# Patient Record
Sex: Female | Born: 1970 | Race: Black or African American | Hispanic: No | Smoking: Current every day smoker
Health system: Southern US, Community
[De-identification: ages and names within clinical notes are randomized; demographics above are authoritative.]

## PROBLEM LIST (undated history)

## (undated) DIAGNOSIS — IMO0001 Reserved for inherently not codable concepts without codable children: Secondary | ICD-10-CM

## (undated) DIAGNOSIS — Z800 Family history of malignant neoplasm of digestive organs: Secondary | ICD-10-CM

## (undated) DIAGNOSIS — J386 Stenosis of larynx: Secondary | ICD-10-CM

## (undated) DIAGNOSIS — Z803 Family history of malignant neoplasm of breast: Secondary | ICD-10-CM

## (undated) DIAGNOSIS — Z8542 Personal history of malignant neoplasm of other parts of uterus: Secondary | ICD-10-CM

## (undated) DIAGNOSIS — I2699 Other pulmonary embolism without acute cor pulmonale: Secondary | ICD-10-CM

## (undated) DIAGNOSIS — I5022 Chronic systolic (congestive) heart failure: Secondary | ICD-10-CM

## (undated) DIAGNOSIS — J45909 Unspecified asthma, uncomplicated: Secondary | ICD-10-CM

## (undated) DIAGNOSIS — J9621 Acute and chronic respiratory failure with hypoxia: Secondary | ICD-10-CM

## (undated) DIAGNOSIS — Z789 Other specified health status: Secondary | ICD-10-CM

## (undated) HISTORY — DX: Unspecified asthma, uncomplicated: J45.909

## (undated) HISTORY — DX: Family history of malignant neoplasm of breast: Z80.3

## (undated) HISTORY — DX: Personal history of malignant neoplasm of other parts of uterus: Z85.42

## (undated) HISTORY — PX: TRACHELECTOMY: SHX6586

## (undated) HISTORY — PX: OTHER SURGICAL HISTORY: SHX169

## (undated) HISTORY — DX: Other specified health status: Z78.9

## (undated) HISTORY — DX: Family history of malignant neoplasm of digestive organs: Z80.0

---

## 2014-11-11 ENCOUNTER — Other Ambulatory Visit: Payer: Medicaid Other

## 2014-11-11 ENCOUNTER — Encounter: Payer: Self-pay | Admitting: Genetic Counselor

## 2014-11-11 ENCOUNTER — Ambulatory Visit (HOSPITAL_BASED_OUTPATIENT_CLINIC_OR_DEPARTMENT_OTHER): Payer: Medicare Other | Admitting: Genetic Counselor

## 2014-11-11 DIAGNOSIS — Z800 Family history of malignant neoplasm of digestive organs: Secondary | ICD-10-CM

## 2014-11-11 DIAGNOSIS — Z803 Family history of malignant neoplasm of breast: Secondary | ICD-10-CM

## 2014-11-11 DIAGNOSIS — Z315 Encounter for genetic counseling: Secondary | ICD-10-CM | POA: Diagnosis not present

## 2014-11-11 DIAGNOSIS — Z8542 Personal history of malignant neoplasm of other parts of uterus: Secondary | ICD-10-CM | POA: Diagnosis not present

## 2014-11-11 NOTE — Progress Notes (Signed)
Patient Name: Molly Frazier Patient Age: 43 y.o. Encounter Date: 11/11/2014  Referring Physician: Arnoldo Morale, MD    Ms. Molly Frazier, a 43 y.o. female, is being seen at the [facility] due to a personal and family history of cancer. She presents to clinic today with her daughter, Alphonsa Overall, to discuss the possibility of a hereditary predisposition to cancer and discuss whether genetic testing is warranted.  HISTORY OF PRESENT ILLNESS: Molly Frazier reported that she was diagnosed with uterine cancer at the age of 75. She reports having had a hysterectomy, but that her ovaries were left intact.  She reports getting a yearly mammogram, clinical breast exam and gynecologic exam. She has not yet had a colonoscopy.   Past Medical History  Diagnosis Date  . History of uterine cancer     Dx at age 76  . Family history of breast cancer   . Family history of colon cancer     History   Social History  . Marital Status: Unknown    Spouse Name: N/A    Number of Children: N/A  . Years of Education: N/A   Social History Main Topics  . Smoking status: Not on file  . Smokeless tobacco: Not on file  . Alcohol Use: Not on file  . Drug Use: Not on file  . Sexual Activity: Not on file   Other Topics Concern  . Not on file   Social History Narrative  . No narrative on file     FAMILY HISTORY:   During the visit, a 4-generation pedigree was obtained. Family tree will be sent for scanning and will be in EPIC under the Media tab.  Significant diagnoses include the following:  Family History  Problem Relation Age of Onset  . Breast cancer Mother 28    currently in her 49s; TAH/BSO ~50  . Colon cancer Sister 74    Additionally, Molly Frazier has three daughters (age 84, 51, and 63). She has 2 brothers and 6 sisters other than the sister noted above. These siblings are young (ages 2s-30s). Her mother has many siblings and she has no information about her father.  Ms.  Frazier ancestry is African American. There is no known Jewish ancestry and no consanguinity.  ASSESSMENT AND PLAN: Molly Frazier is a 43 y.o. female with a personal history of uterine cancer diagnosed at age 37 and a family history of colon cancer at age 79 in her sister and breast cancer at age 51 in her maternal grandmother. Even though she has a large family, this history is suggestive of a hereditary predisposition to cancer, specifically Lynch syndrome. Her siblings are relatively young. Furthermore, there is no information at all about her father or any paternal relatives. We also addressed some of the genes that may cause breast cancer due to her mother's history. We reviewed the characteristics, features and inheritance patterns of hereditary cancer syndromes. We also discussed genetic testing, including the other appropriate family members to test, the process of testing, insurance coverage and implications of results. A negative result will need to be interpreted with some caution and she is aware that her risk of colon and breast cancers would be considered elevated given the family history.  Ms. Fojtik wished to pursue genetic testing and a blood sample will be sent to [facility] for analysis of the 24 genes on the OvaNext gene panel (ATM, BARD1, BRCA1, BRCA2, BRIP1, CDH1, CHEK2, EPCAM, MLH1, MRE11A, MSH2, MSH6, MUTYH, NBN, NF1, PALB2, PMS2,  PTEN, RAD50, RAD51C, RAD51D, SMARCA4, STK11, and TP53). This panel was selected to combine the Lynch genes with those contributing to breast cancer. We discussed the implications of a positive, negative and/ or Variant of Uncertain Significance (VUS) result. Results should be available in approximately 4-5 weeks, at which point we will contact her and address implications for her as well as address genetic testing for at-risk family members, if needed.    We encouraged Ms. Redditt to remain in contact with Cancer Genetics annually so that we can update the family  history and inform her of any changes in cancer genetics and testing that may be of benefit for this family. Ms.  Husk questions were answered to her satisfaction today.   Thank you for the referral and allowing [place] to share in the care of your patient.   The patient was seen for a total of 30 minutes, greater than 50% of which was spent face-to-face counseling. This patient was discussed with the overseeing provider who agrees with the above.   Steele Berg, MS, [place] Certified Genetic Counselor phone: [phone] [email or web address]

## 2014-12-09 ENCOUNTER — Encounter: Payer: Self-pay | Admitting: Genetic Counselor

## 2014-12-09 DIAGNOSIS — Z1379 Encounter for other screening for genetic and chromosomal anomalies: Secondary | ICD-10-CM | POA: Insufficient documentation

## 2014-12-09 NOTE — Progress Notes (Signed)
GENETIC TEST RESULTS  Patient Name: Molly Frazier Patient Age: 43 y.o. Encounter Date: 12/09/2014  Referring Physician: Arnoldo Morale, MD   Ms. Vanloan was called today to discuss genetic test results. Please see the Genetics note from her visit on 11/11/14 for a detailed discussion of her personal and family history.  GENETIC TESTING: At the time of Ms. Tonkinson's visit, we recommended she pursue genetic testing of multiple genes on the OvaNext gene panel. This test, which included sequencing and deletion/duplication analysis of 24 genes, was performed at [facility]. Testing was normal and did not reveal a mutation in these genes. The genes tested were ATM, BARD1, BRCA1, BRCA2, BRIP1, CDH1, CHEK2, EPCAM, MLH1, MRE11A, MSH2, MSH6, MUTYH, NBN, NF1, PALB2, PMS2, PTEN, RAD50, RAD51C, RAD51D, SMARCA4, STK11, and TP53.  We discussed with Ms. Millar that since the current test is not perfect, it is possible there may be a gene mutation that current testing cannot detect, but that chance is small. We also discussed that it is possible that a different genetic factor, which was not part of this testing or has not yet been discovered, is responsible for the cancer diagnoses in the family. Should Ms. Closson wish to discuss or pursue this additional testing, we are happy to coordinate this at any time, but do not feel that she is at significant risk of harboring a mutation in a different gene. Lastly, it may be that her sister who had colon cancer harbors a pathogenic mutation that Ms. Gillin did not inherit.  CANCER SCREENING: Given the personal and family histories, we must interpret these negative results with some caution. Families with features suggestive of hereditary risk tend to have multiple family members with cancer, diagnoses in multiple generations and diagnoses before the age of 72. Ms. Burdett family exhibits some of these features.  Ms. Gaetano is recommended to continue having a yearly  mammogram, clinical breast exam and gynecologic exam. Given her sister's age at colon cancer diagnosis (age 64), she is recommended to speak with her physicians about initiating colon cancer screening now.   FAMILY MEMBERS: We recommended further genetic testing in Ms. Gabrys's family as such testing might help [place] be even more confident in interpreting Ms. Jacuinde's own results. Her sister who reportedly had colon cancer at age 53 should get tested. Please let us know if we can help facilitate testing. Genetic counselors can be located in other cities, by visiting the website of the [employer] (ArtistMovie.se) and Field seismologist for a Dietitian by zip code. If a mutation is found in her sister, Ms. Dines should provide [place] a copy so we can confirm she does not have that mutation.  Given the family history, Ms. Knowlton's relatives may be at increased risk for cancer over the general population and should speak with their own doctors about appropriate cancer screenings.   Lastly, we discussed with Ms. Payes that cancer genetics is a rapidly advancing field and it is possible that new genetic tests will be appropriate for her in the future. We encouraged her to remain in contact with [place] on an annual basis so we can update her personal and family histories, and let her know of advances in cancer genetics that may benefit the family. Our contact number was provided. Ms. Rahrig questions were answered to her satisfaction today, and she knows she is welcome to call anytime with additional questions.    Steele Berg, MS, [place] Certified Genetic Counselor phone: [phone] [email or web address]

## 2015-08-08 DIAGNOSIS — J45909 Unspecified asthma, uncomplicated: Secondary | ICD-10-CM | POA: Insufficient documentation

## 2015-08-08 DIAGNOSIS — J309 Allergic rhinitis, unspecified: Principal | ICD-10-CM

## 2015-08-08 DIAGNOSIS — Z91018 Allergy to other foods: Secondary | ICD-10-CM

## 2015-08-08 DIAGNOSIS — H1010 Acute atopic conjunctivitis, unspecified eye: Secondary | ICD-10-CM

## 2015-08-08 DIAGNOSIS — K200 Eosinophilic esophagitis: Secondary | ICD-10-CM | POA: Insufficient documentation

## 2015-09-06 ENCOUNTER — Ambulatory Visit (INDEPENDENT_AMBULATORY_CARE_PROVIDER_SITE_OTHER): Payer: Medicare Other

## 2015-09-06 DIAGNOSIS — J309 Allergic rhinitis, unspecified: Secondary | ICD-10-CM | POA: Diagnosis not present

## 2015-09-06 DIAGNOSIS — H1010 Acute atopic conjunctivitis, unspecified eye: Secondary | ICD-10-CM | POA: Diagnosis not present

## 2015-09-09 HISTORY — PX: ESOPHAGEAL DILATION: SHX303

## 2015-09-12 ENCOUNTER — Ambulatory Visit (INDEPENDENT_AMBULATORY_CARE_PROVIDER_SITE_OTHER): Payer: Medicare Other

## 2015-09-12 DIAGNOSIS — H1010 Acute atopic conjunctivitis, unspecified eye: Secondary | ICD-10-CM

## 2015-09-12 DIAGNOSIS — J309 Allergic rhinitis, unspecified: Secondary | ICD-10-CM

## 2015-09-13 ENCOUNTER — Ambulatory Visit (INDEPENDENT_AMBULATORY_CARE_PROVIDER_SITE_OTHER): Payer: Medicare Other | Admitting: Pediatrics

## 2015-09-13 ENCOUNTER — Ambulatory Visit: Payer: Medicare Other

## 2015-09-13 ENCOUNTER — Encounter: Payer: Self-pay | Admitting: Pediatrics

## 2015-09-13 VITALS — BP 110/62 | HR 94 | Temp 99.0°F | Resp 18 | Ht 61.5 in | Wt 167.0 lb

## 2015-09-13 DIAGNOSIS — T7840XA Allergy, unspecified, initial encounter: Secondary | ICD-10-CM

## 2015-09-13 DIAGNOSIS — J4530 Mild persistent asthma, uncomplicated: Secondary | ICD-10-CM

## 2015-09-13 MED ORDER — MONTELUKAST SODIUM 10 MG PO TABS: 10.0000 mg | ORAL_TABLET | Freq: Every day | ORAL | Status: DC

## 2015-09-13 MED ORDER — LEVOCETIRIZINE DIHYDROCHLORIDE 5 MG PO TABS: 5.0000 mg | ORAL_TABLET | Freq: Every evening | ORAL | Status: DC

## 2015-09-13 NOTE — Assessment & Plan Note (Signed)
The patient has stopped all of her asthma medications. If she needs to use pro-air more than twice a week, she will resume montelukast as 10 mg once a day. She should have a flu vaccination follow-up in 3 months but she will call us if she is not doing well on this treatment plan

## 2015-09-13 NOTE — Patient Instructions (Signed)
Allergic reaction Since she had an allergic reaction we will reduce her dose of allergy injections to Gold vial according to schedule A  She will resume the use of levocetirizine 5 mg once a day. If she needs to use albuterol more than twice a week, she she will resume montelukast 10 mg once a day.  We will see her in follow-up in 3 months.  Mild persistent asthma The patient has stopped all of her asthma medications. If she needs to use pro-air more than twice a week, she will resume montelukast as 10 mg once a day. She should have a flu vaccination follow-up in 3 months but she will call us if she is not doing well on this treatment plan   Return in about 3 months (around 12/14/2015).

## 2015-09-13 NOTE — Assessment & Plan Note (Signed)
Since she had an allergic reaction we will reduce her dose of allergy injections to Gold vial according to schedule A  She will resume the use of levocetirizine 5 mg once a day. If she needs to use albuterol more than twice a week, she she will resume montelukast 10 mg once a day.  We will see her in follow-up in 3 months.

## 2015-09-13 NOTE — Progress Notes (Signed)
NEW PATIENT NOTE  RE: Molly Frazier MRN: [id number] DOB: 30-Jan-1971 [facility] [address] [place] [address] Date of Office Visit: 09/13/2015   Referring provider: No referring provider defined for this encounter.  Chief Complaint: Allergic Reaction    HPI the patient received an allergy injection out of the green vial on Oct.3 She received 0.2 mL. She developed burning of her face and some itching. She was taken to the emergency room and was given Benadryl. She has not had any coughing or wheezing. She has not been using levocetirizine or montelukast om a daily basis She had an esophageal dilation on 09/09/2015    ROS: Per HPI unless specifically indicated below ROS   Drug Allergies:  Allergies  Allergen Reactions  . Oxycodone Nausea And Vomiting  . Wellbutrin [Bupropion]   . Benadryl [Diphenhydramine Hcl] Rash  . Vicodin [Hydrocodone-Acetaminophen] Rash     Physical Exam: BP 110/62 mmHg  Pulse 94  Temp(Src) 99 F (37.2 C) (Oral)  Resp 18  Ht 5' 1.5" (1.562 m)  Wt 167 lb (75.751 kg)  BMI 31.05 kg/m2  Physical Exam  Constitutional: She appears well-developed and well-nourished.  HENT:  Right Ear: Tympanic membrane normal.  Left Ear: Tympanic membrane normal.  Nose: Mucosal edema and rhinorrhea present.  Mouth/Throat: Oropharynx is clear and moist.  Eyes: Conjunctivae are normal.  Neck: Neck supple. No thyromegaly present.  Cardiovascular: Normal rate, regular rhythm and normal heart sounds.   No murmur heard. Pulmonary/Chest: Effort normal and breath sounds normal.  Abdominal: Soft. There is no hepatomegaly.  Lymphadenopathy:    She has no cervical adenopathy.  Neurological: She is alert.  Skin:  clear      Diagnostics:  Forced vital capacity 2.81 L FEV1 2.34 L predicted forced vital capacity 2.58 L predicted FEV1 2.18 L-the spirometry is in the normal  range   Assessment and Plan:  Allergic reaction Since she had an allergic reaction we will reduce her dose of allergy injections to Gold vial according to schedule A  She will resume the use of levocetirizine 5 mg once a day. If she needs to use albuterol more than twice a week, she she will resume montelukast 10 mg once a day.  We will see her in follow-up in 3 months.  Mild persistent asthma The patient has stopped all of her asthma medications. If she needs to use pro-air more than twice a week, she will resume montelukast as 10 mg once a day. She should have a flu vaccination follow-up in 3 months but she will call us if she is not doing well on this treatment plan    Return in about 3 months (around 12/14/2015).  Thank you for the opportunity to care for this patient.  Please do not hesitate to contact me with questions.  [facility] [address] [place], [place] [address] [phone]  J. Posey Rea, M.D.

## 2015-09-19 ENCOUNTER — Ambulatory Visit (INDEPENDENT_AMBULATORY_CARE_PROVIDER_SITE_OTHER): Payer: Medicare Other

## 2015-09-19 DIAGNOSIS — J309 Allergic rhinitis, unspecified: Secondary | ICD-10-CM

## 2015-09-19 DIAGNOSIS — H1010 Acute atopic conjunctivitis, unspecified eye: Secondary | ICD-10-CM | POA: Diagnosis not present

## 2015-09-26 ENCOUNTER — Ambulatory Visit (INDEPENDENT_AMBULATORY_CARE_PROVIDER_SITE_OTHER): Payer: Medicare Other | Admitting: *Deleted

## 2015-09-26 DIAGNOSIS — J309 Allergic rhinitis, unspecified: Secondary | ICD-10-CM

## 2015-09-26 DIAGNOSIS — H1010 Acute atopic conjunctivitis, unspecified eye: Secondary | ICD-10-CM

## 2015-10-04 ENCOUNTER — Ambulatory Visit (INDEPENDENT_AMBULATORY_CARE_PROVIDER_SITE_OTHER): Payer: Medicare Other | Admitting: *Deleted

## 2015-10-04 DIAGNOSIS — H1010 Acute atopic conjunctivitis, unspecified eye: Secondary | ICD-10-CM

## 2015-10-04 DIAGNOSIS — J309 Allergic rhinitis, unspecified: Secondary | ICD-10-CM

## 2015-10-10 ENCOUNTER — Ambulatory Visit (INDEPENDENT_AMBULATORY_CARE_PROVIDER_SITE_OTHER): Payer: Medicare Other

## 2015-10-10 DIAGNOSIS — J309 Allergic rhinitis, unspecified: Secondary | ICD-10-CM

## 2015-10-17 ENCOUNTER — Ambulatory Visit (INDEPENDENT_AMBULATORY_CARE_PROVIDER_SITE_OTHER): Payer: Medicare Other

## 2015-10-17 DIAGNOSIS — J309 Allergic rhinitis, unspecified: Secondary | ICD-10-CM | POA: Diagnosis not present

## 2015-10-31 ENCOUNTER — Ambulatory Visit (INDEPENDENT_AMBULATORY_CARE_PROVIDER_SITE_OTHER): Payer: Medicare Other

## 2015-10-31 DIAGNOSIS — J309 Allergic rhinitis, unspecified: Secondary | ICD-10-CM | POA: Diagnosis not present

## 2015-11-07 ENCOUNTER — Ambulatory Visit (INDEPENDENT_AMBULATORY_CARE_PROVIDER_SITE_OTHER): Payer: Medicare Other

## 2015-11-07 DIAGNOSIS — J309 Allergic rhinitis, unspecified: Secondary | ICD-10-CM

## 2015-11-14 ENCOUNTER — Ambulatory Visit (INDEPENDENT_AMBULATORY_CARE_PROVIDER_SITE_OTHER): Payer: Medicare Other | Admitting: *Deleted

## 2015-11-14 DIAGNOSIS — J309 Allergic rhinitis, unspecified: Secondary | ICD-10-CM

## 2015-11-21 ENCOUNTER — Ambulatory Visit (INDEPENDENT_AMBULATORY_CARE_PROVIDER_SITE_OTHER): Payer: Medicare Other

## 2015-11-21 DIAGNOSIS — J309 Allergic rhinitis, unspecified: Secondary | ICD-10-CM | POA: Diagnosis not present

## 2015-11-28 ENCOUNTER — Ambulatory Visit (INDEPENDENT_AMBULATORY_CARE_PROVIDER_SITE_OTHER): Payer: Medicare Other | Admitting: Allergy and Immunology

## 2015-11-28 ENCOUNTER — Encounter: Payer: Self-pay | Admitting: Allergy and Immunology

## 2015-11-28 VITALS — BP 110/60 | HR 72 | Temp 98.0°F | Resp 16 | Ht 61.42 in | Wt 166.6 lb

## 2015-11-28 DIAGNOSIS — Z91018 Allergy to other foods: Secondary | ICD-10-CM | POA: Diagnosis not present

## 2015-11-28 DIAGNOSIS — J4530 Mild persistent asthma, uncomplicated: Secondary | ICD-10-CM

## 2015-11-28 DIAGNOSIS — H1010 Acute atopic conjunctivitis, unspecified eye: Secondary | ICD-10-CM

## 2015-11-28 DIAGNOSIS — K200 Eosinophilic esophagitis: Secondary | ICD-10-CM

## 2015-11-28 DIAGNOSIS — J309 Allergic rhinitis, unspecified: Secondary | ICD-10-CM

## 2015-11-28 MED ORDER — LEVOCETIRIZINE DIHYDROCHLORIDE 5 MG PO TABS: 5.0000 mg | ORAL_TABLET | Freq: Every evening | ORAL | Status: DC

## 2015-11-28 MED ORDER — ALBUTEROL SULFATE HFA 108 (90 BASE) MCG/ACT IN AERS: 2.0000 | INHALATION_SPRAY | Freq: Four times a day (QID) | RESPIRATORY_TRACT | Status: DC | PRN

## 2015-11-28 MED ORDER — MONTELUKAST SODIUM 10 MG PO TABS: 10.0000 mg | ORAL_TABLET | Freq: Every day | ORAL | Status: DC

## 2015-11-28 MED ORDER — FLUTICASONE PROPIONATE 50 MCG/ACT NA SUSP: 2.0000 | NASAL | Status: DC | PRN

## 2015-11-28 NOTE — Progress Notes (Signed)
RE: Molly Frazier MRN: [id number] DOB: Apr 22, 1971 [facility] [facility] [address] [place] [id number] Date of Office Visit: 11/28/2015  Referring provider: No referring provider defined for this encounter.  History of present illness: HPI Comments: Molly Frazier is a 44 y.o. female with allergic rhinoconjunctivitis, asthma, and history of eosinophilic esophagitis who presents today for follow up.  He reports that her asthma has been well-controlled in the interval since her previous visit.  She only requires albuterol rescue on rare occasions during rapid weather changes.  She has no nasal symptom complaints today.  She reports that since her esophageal dilation 3 or 4 months ago, she has had no eosinophilic esophagitis related complaints.  She has been instructed by her gastroenterologist to follow up on an as-needed basis.  He reports that she quit smoking cigarettes 2 months ago.  She needs medication refills.   Assessment and plan: Mild persistent asthma  Continue montelukast 10 mg daily at bedtime and albuterol every 4-6 hours as needed.  Subjective and objective measures of pulmonary function will be followed and the treatment plan will be adjusted accordingly.  Allergic rhinoconjunctivitis  Continue allergen avoidance measures, montelukast daily, levocetirizine 5 mg daily as needed, and fluticasone nasal spray as needed.  Esophagitis, eosinophilic  Follow up with gastroenterologist as directed.  History of food allergy  Continue avoidance of egg and have access to epinephrine autoinjector 2 pack until allergy is definitively ruled out with open graded oral challenge.    Medications ordered this encounter: Meds ordered this encounter  Medications  . levocetirizine (XYZAL) 5 MG tablet    Sig: Take 1 tablet (5 mg total) by mouth every evening.    Dispense:  30 tablet    Refill:  5  . montelukast  (SINGULAIR) 10 MG tablet    Sig: Take 1 tablet (10 mg total) by mouth at bedtime.    Dispense:  30 tablet    Refill:  5  . fluticasone (FLONASE) 50 MCG/ACT nasal spray    Sig: Place 2 sprays into both nostrils as needed for allergies or rhinitis. Reported on 11/28/2015    Dispense:  15.8 g    Refill:  5  . albuterol (PROAIR HFA) 108 (90 BASE) MCG/ACT inhaler    Sig: Inhale 2 puffs into the lungs every 6 (six) hours as needed. Reported on 11/28/2015    Dispense:  1 Inhaler    Refill:  3    Diagnositics: Spirometry:  Normal with an FEV1 of 114% predicted.  Please see scanned spirometry results for details.    Physical examination: Blood pressure 110/60, pulse 72, temperature 98 F (36.7 C), resp. rate 16, height 5' 1.42" (1.56 m), weight 166 lb 9.6 oz (75.569 kg).  General: Alert, interactive, in no acute distress. HEENT: TMs pearly gray, turbinates mildly edematous without discharge, post-pharynx mildly erythematous. Neck: Supple without lymphadenopathy. Lungs: Clear to auscultation without wheezing, rhonchi or rales. CV: Normal S1, S2 without murmurs. Skin: Warm and dry, without lesions or rashes.  The following portions of the patient's history were reviewed and updated as appropriate: allergies, current medications, past family history, past medical history, past social history, past surgical history and problem list.  Outpatient medications:   Medication List       This list is accurate as of: 11/28/15  1:32 PM.  Always use your most recent med list.  albuterol 108 (90 BASE) MCG/ACT inhaler  Commonly known as:  PROAIR HFA  Inhale 2 puffs into the lungs every 6 (six) hours as needed. Reported on 11/28/2015     carbamazepine 200 MG 12 hr capsule  Commonly known as:  CARBATROL  Take 200 mg by mouth 2 (two) times daily. Reported on 11/28/2015     CHANTIX PO  Take 1 tablet by mouth 2 (two) times daily. Reported on 11/28/2015     clonazePAM 0.5 MG tablet   Commonly known as:  KLONOPIN  Take 0.5 mg by mouth 2 (two) times daily. Reported on 11/28/2015     cyclobenzaprine 10 MG tablet  Commonly known as:  FLEXERIL  Take 10 mg by mouth 3 (three) times daily. Reported on 11/28/2015     EPIPEN 2-PAK 0.3 mg/0.3 mL Soaj injection  Generic drug:  EPINEPHrine  Inject 0.3 mg into the muscle as needed.     fluticasone 50 MCG/ACT nasal spray  Commonly known as:  FLONASE  Place 2 sprays into both nostrils as needed for allergies or rhinitis. Reported on 11/28/2015     levocetirizine 5 MG tablet  Commonly known as:  XYZAL  Take 1 tablet (5 mg total) by mouth every evening.     loxapine 50 MG capsule  Commonly known as:  LOXITANE  Take 50 mg by mouth 2 (two) times daily.     meloxicam 7.5 MG tablet  Commonly known as:  MOBIC  Take 7.5 mg by mouth daily. Reported on 11/28/2015     montelukast 10 MG tablet  Commonly known as:  SINGULAIR  Take 1 tablet (10 mg total) by mouth at bedtime.     omeprazole 40 MG capsule  Commonly known as:  PRILOSEC  Take 40 mg by mouth 2 (two) times daily. Reported on 11/28/2015        Known medication allergies: Allergies  Allergen Reactions  . Oxycodone Nausea And Vomiting  . Wellbutrin [Bupropion]   . Benadryl [Diphenhydramine Hcl] Rash  . Vicodin [Hydrocodone-Acetaminophen] Rash    I appreciate the opportunity to take part in this [name]'s care. Please do not hesitate to contact me with questions.  Sincerely,   R. Jorene Guest, MD

## 2015-11-28 NOTE — Assessment & Plan Note (Signed)
   Continue avoidance of egg and have access to epinephrine autoinjector 2 pack until allergy is definitively ruled out with open graded oral challenge.

## 2015-11-28 NOTE — Assessment & Plan Note (Signed)
   Continue allergen avoidance measures, montelukast daily, levocetirizine 5 mg daily as needed, and fluticasone nasal spray as needed.

## 2015-11-28 NOTE — Patient Instructions (Signed)
Mild persistent asthma  Continue montelukast 10 mg daily at bedtime and albuterol every 4-6 hours as needed.  Subjective and objective measures of pulmonary function will be followed and the treatment plan will be adjusted accordingly.  Allergic rhinoconjunctivitis  Continue allergen avoidance measures, montelukast daily, levocetirizine 5 mg daily as needed, and fluticasone nasal spray as needed.  Esophagitis, eosinophilic  Follow up with gastroenterologist as directed.  History of food allergy  Continue avoidance of egg and have access to epinephrine autoinjector 2 pack until allergy is definitively ruled out with open graded oral challenge.   Return in about 6 months (around 05/28/2016), or if symptoms worsen or fail to improve.

## 2015-11-28 NOTE — Assessment & Plan Note (Signed)
   Follow up with gastroenterologist as directed.

## 2015-11-28 NOTE — Assessment & Plan Note (Signed)
   Continue montelukast 10 mg daily at bedtime and albuterol every 4-6 hours as needed.  Subjective and objective measures of pulmonary function will be followed and the treatment plan will be adjusted accordingly.

## 2015-12-06 ENCOUNTER — Ambulatory Visit (INDEPENDENT_AMBULATORY_CARE_PROVIDER_SITE_OTHER): Payer: Medicare Other | Admitting: *Deleted

## 2015-12-06 DIAGNOSIS — J309 Allergic rhinitis, unspecified: Secondary | ICD-10-CM | POA: Diagnosis not present

## 2015-12-13 ENCOUNTER — Ambulatory Visit (INDEPENDENT_AMBULATORY_CARE_PROVIDER_SITE_OTHER): Payer: Medicare Other | Admitting: *Deleted

## 2015-12-13 DIAGNOSIS — J309 Allergic rhinitis, unspecified: Secondary | ICD-10-CM

## 2015-12-20 ENCOUNTER — Ambulatory Visit (INDEPENDENT_AMBULATORY_CARE_PROVIDER_SITE_OTHER): Payer: Medicare Other

## 2015-12-20 DIAGNOSIS — J309 Allergic rhinitis, unspecified: Secondary | ICD-10-CM

## 2015-12-27 ENCOUNTER — Ambulatory Visit (INDEPENDENT_AMBULATORY_CARE_PROVIDER_SITE_OTHER): Payer: Medicare Other

## 2015-12-27 DIAGNOSIS — J309 Allergic rhinitis, unspecified: Secondary | ICD-10-CM

## 2016-01-02 ENCOUNTER — Ambulatory Visit (INDEPENDENT_AMBULATORY_CARE_PROVIDER_SITE_OTHER): Payer: Medicare Other | Admitting: *Deleted

## 2016-01-02 DIAGNOSIS — J309 Allergic rhinitis, unspecified: Secondary | ICD-10-CM | POA: Diagnosis not present

## 2016-01-09 ENCOUNTER — Ambulatory Visit (INDEPENDENT_AMBULATORY_CARE_PROVIDER_SITE_OTHER): Payer: Medicare Other

## 2016-01-09 DIAGNOSIS — J309 Allergic rhinitis, unspecified: Secondary | ICD-10-CM

## 2016-01-16 ENCOUNTER — Ambulatory Visit (INDEPENDENT_AMBULATORY_CARE_PROVIDER_SITE_OTHER): Payer: Medicare Other | Admitting: *Deleted

## 2016-01-16 DIAGNOSIS — J309 Allergic rhinitis, unspecified: Secondary | ICD-10-CM | POA: Diagnosis not present

## 2016-01-24 ENCOUNTER — Ambulatory Visit (INDEPENDENT_AMBULATORY_CARE_PROVIDER_SITE_OTHER): Payer: Medicare Other

## 2016-01-24 DIAGNOSIS — J309 Allergic rhinitis, unspecified: Secondary | ICD-10-CM | POA: Diagnosis not present

## 2016-01-30 ENCOUNTER — Ambulatory Visit (INDEPENDENT_AMBULATORY_CARE_PROVIDER_SITE_OTHER): Payer: Medicare Other

## 2016-01-30 DIAGNOSIS — J309 Allergic rhinitis, unspecified: Secondary | ICD-10-CM

## 2016-02-06 ENCOUNTER — Ambulatory Visit (INDEPENDENT_AMBULATORY_CARE_PROVIDER_SITE_OTHER): Payer: Medicare Other

## 2016-02-06 DIAGNOSIS — J309 Allergic rhinitis, unspecified: Secondary | ICD-10-CM | POA: Diagnosis not present

## 2016-02-13 ENCOUNTER — Ambulatory Visit (INDEPENDENT_AMBULATORY_CARE_PROVIDER_SITE_OTHER): Payer: Medicare Other

## 2016-02-13 DIAGNOSIS — J309 Allergic rhinitis, unspecified: Secondary | ICD-10-CM | POA: Diagnosis not present

## 2016-02-20 ENCOUNTER — Ambulatory Visit (INDEPENDENT_AMBULATORY_CARE_PROVIDER_SITE_OTHER): Payer: Medicare Other

## 2016-02-20 DIAGNOSIS — J309 Allergic rhinitis, unspecified: Secondary | ICD-10-CM | POA: Diagnosis not present

## 2016-02-27 ENCOUNTER — Ambulatory Visit (INDEPENDENT_AMBULATORY_CARE_PROVIDER_SITE_OTHER): Payer: Medicare Other

## 2016-02-27 DIAGNOSIS — J309 Allergic rhinitis, unspecified: Secondary | ICD-10-CM | POA: Diagnosis not present

## 2016-03-05 ENCOUNTER — Ambulatory Visit (INDEPENDENT_AMBULATORY_CARE_PROVIDER_SITE_OTHER): Payer: Medicare Other

## 2016-03-05 DIAGNOSIS — J309 Allergic rhinitis, unspecified: Secondary | ICD-10-CM

## 2016-03-10 DIAGNOSIS — J301 Allergic rhinitis due to pollen: Secondary | ICD-10-CM | POA: Diagnosis not present

## 2016-03-11 DIAGNOSIS — J3081 Allergic rhinitis due to animal (cat) (dog) hair and dander: Secondary | ICD-10-CM

## 2016-03-12 ENCOUNTER — Ambulatory Visit (INDEPENDENT_AMBULATORY_CARE_PROVIDER_SITE_OTHER): Payer: Medicare Other

## 2016-03-12 DIAGNOSIS — J309 Allergic rhinitis, unspecified: Secondary | ICD-10-CM | POA: Diagnosis not present

## 2016-03-19 ENCOUNTER — Ambulatory Visit (INDEPENDENT_AMBULATORY_CARE_PROVIDER_SITE_OTHER): Payer: Medicare Other

## 2016-03-19 DIAGNOSIS — J309 Allergic rhinitis, unspecified: Secondary | ICD-10-CM

## 2016-03-26 ENCOUNTER — Ambulatory Visit (INDEPENDENT_AMBULATORY_CARE_PROVIDER_SITE_OTHER): Payer: Medicare Other

## 2016-03-26 DIAGNOSIS — J309 Allergic rhinitis, unspecified: Secondary | ICD-10-CM | POA: Diagnosis not present

## 2016-04-04 ENCOUNTER — Ambulatory Visit (INDEPENDENT_AMBULATORY_CARE_PROVIDER_SITE_OTHER): Payer: Medicare Other

## 2016-04-04 DIAGNOSIS — J309 Allergic rhinitis, unspecified: Secondary | ICD-10-CM

## 2016-04-10 ENCOUNTER — Ambulatory Visit (INDEPENDENT_AMBULATORY_CARE_PROVIDER_SITE_OTHER): Payer: Medicare Other

## 2016-04-10 DIAGNOSIS — J309 Allergic rhinitis, unspecified: Secondary | ICD-10-CM | POA: Diagnosis not present

## 2016-04-16 ENCOUNTER — Ambulatory Visit (INDEPENDENT_AMBULATORY_CARE_PROVIDER_SITE_OTHER): Payer: Medicare Other

## 2016-04-16 DIAGNOSIS — J309 Allergic rhinitis, unspecified: Secondary | ICD-10-CM | POA: Diagnosis not present

## 2016-04-23 ENCOUNTER — Ambulatory Visit (INDEPENDENT_AMBULATORY_CARE_PROVIDER_SITE_OTHER): Payer: Medicare Other

## 2016-04-23 DIAGNOSIS — J309 Allergic rhinitis, unspecified: Secondary | ICD-10-CM | POA: Diagnosis not present

## 2016-04-30 ENCOUNTER — Ambulatory Visit (INDEPENDENT_AMBULATORY_CARE_PROVIDER_SITE_OTHER): Payer: Medicare Other

## 2016-04-30 DIAGNOSIS — J309 Allergic rhinitis, unspecified: Secondary | ICD-10-CM

## 2016-05-08 ENCOUNTER — Ambulatory Visit (INDEPENDENT_AMBULATORY_CARE_PROVIDER_SITE_OTHER): Payer: Medicare Other

## 2016-05-08 DIAGNOSIS — J309 Allergic rhinitis, unspecified: Secondary | ICD-10-CM

## 2016-05-14 ENCOUNTER — Ambulatory Visit (INDEPENDENT_AMBULATORY_CARE_PROVIDER_SITE_OTHER): Payer: Medicare Other

## 2016-05-14 DIAGNOSIS — J309 Allergic rhinitis, unspecified: Secondary | ICD-10-CM

## 2016-05-21 ENCOUNTER — Ambulatory Visit (INDEPENDENT_AMBULATORY_CARE_PROVIDER_SITE_OTHER): Payer: Medicare Other

## 2016-05-21 DIAGNOSIS — J309 Allergic rhinitis, unspecified: Secondary | ICD-10-CM

## 2016-05-28 ENCOUNTER — Ambulatory Visit (INDEPENDENT_AMBULATORY_CARE_PROVIDER_SITE_OTHER): Payer: Medicare Other

## 2016-05-28 DIAGNOSIS — J309 Allergic rhinitis, unspecified: Secondary | ICD-10-CM

## 2016-06-05 ENCOUNTER — Ambulatory Visit (INDEPENDENT_AMBULATORY_CARE_PROVIDER_SITE_OTHER): Payer: Medicare Other

## 2016-06-05 DIAGNOSIS — J309 Allergic rhinitis, unspecified: Secondary | ICD-10-CM | POA: Diagnosis not present

## 2016-06-13 ENCOUNTER — Ambulatory Visit (INDEPENDENT_AMBULATORY_CARE_PROVIDER_SITE_OTHER): Payer: Medicare Other

## 2016-06-13 DIAGNOSIS — J309 Allergic rhinitis, unspecified: Secondary | ICD-10-CM

## 2016-06-18 ENCOUNTER — Ambulatory Visit (INDEPENDENT_AMBULATORY_CARE_PROVIDER_SITE_OTHER): Payer: Medicare Other

## 2016-06-18 DIAGNOSIS — J309 Allergic rhinitis, unspecified: Secondary | ICD-10-CM | POA: Diagnosis not present

## 2016-06-25 ENCOUNTER — Ambulatory Visit (INDEPENDENT_AMBULATORY_CARE_PROVIDER_SITE_OTHER): Payer: Medicare Other

## 2016-06-25 DIAGNOSIS — J309 Allergic rhinitis, unspecified: Secondary | ICD-10-CM | POA: Diagnosis not present

## 2016-07-02 ENCOUNTER — Ambulatory Visit (INDEPENDENT_AMBULATORY_CARE_PROVIDER_SITE_OTHER): Payer: Medicare Other

## 2016-07-02 DIAGNOSIS — J309 Allergic rhinitis, unspecified: Secondary | ICD-10-CM

## 2016-07-09 ENCOUNTER — Ambulatory Visit (INDEPENDENT_AMBULATORY_CARE_PROVIDER_SITE_OTHER): Payer: Medicare Other

## 2016-07-09 DIAGNOSIS — J309 Allergic rhinitis, unspecified: Secondary | ICD-10-CM

## 2016-07-12 DIAGNOSIS — J3081 Allergic rhinitis due to animal (cat) (dog) hair and dander: Secondary | ICD-10-CM | POA: Diagnosis not present

## 2016-07-16 ENCOUNTER — Ambulatory Visit (INDEPENDENT_AMBULATORY_CARE_PROVIDER_SITE_OTHER): Payer: Medicare Other

## 2016-07-16 DIAGNOSIS — J309 Allergic rhinitis, unspecified: Secondary | ICD-10-CM

## 2016-07-23 ENCOUNTER — Ambulatory Visit (INDEPENDENT_AMBULATORY_CARE_PROVIDER_SITE_OTHER): Payer: Medicare Other

## 2016-07-23 DIAGNOSIS — J309 Allergic rhinitis, unspecified: Secondary | ICD-10-CM

## 2016-07-30 ENCOUNTER — Ambulatory Visit (INDEPENDENT_AMBULATORY_CARE_PROVIDER_SITE_OTHER): Payer: Medicare Other

## 2016-07-30 DIAGNOSIS — J309 Allergic rhinitis, unspecified: Secondary | ICD-10-CM | POA: Diagnosis not present

## 2016-08-06 ENCOUNTER — Ambulatory Visit (INDEPENDENT_AMBULATORY_CARE_PROVIDER_SITE_OTHER): Payer: Medicare Other

## 2016-08-06 DIAGNOSIS — J309 Allergic rhinitis, unspecified: Secondary | ICD-10-CM | POA: Diagnosis not present

## 2016-08-14 ENCOUNTER — Ambulatory Visit (INDEPENDENT_AMBULATORY_CARE_PROVIDER_SITE_OTHER): Payer: Medicare Other

## 2016-08-14 DIAGNOSIS — J309 Allergic rhinitis, unspecified: Secondary | ICD-10-CM | POA: Diagnosis not present

## 2016-08-20 ENCOUNTER — Ambulatory Visit (INDEPENDENT_AMBULATORY_CARE_PROVIDER_SITE_OTHER): Payer: Medicare Other

## 2016-08-20 DIAGNOSIS — J309 Allergic rhinitis, unspecified: Secondary | ICD-10-CM | POA: Diagnosis not present

## 2016-08-27 ENCOUNTER — Ambulatory Visit (INDEPENDENT_AMBULATORY_CARE_PROVIDER_SITE_OTHER): Payer: Medicare Other

## 2016-08-27 DIAGNOSIS — J309 Allergic rhinitis, unspecified: Secondary | ICD-10-CM

## 2016-09-03 ENCOUNTER — Ambulatory Visit (INDEPENDENT_AMBULATORY_CARE_PROVIDER_SITE_OTHER): Payer: Medicare Other

## 2016-09-03 DIAGNOSIS — J309 Allergic rhinitis, unspecified: Secondary | ICD-10-CM

## 2016-09-10 ENCOUNTER — Ambulatory Visit (INDEPENDENT_AMBULATORY_CARE_PROVIDER_SITE_OTHER): Payer: Medicare Other

## 2016-09-10 DIAGNOSIS — J309 Allergic rhinitis, unspecified: Secondary | ICD-10-CM | POA: Diagnosis not present

## 2016-09-17 ENCOUNTER — Ambulatory Visit (INDEPENDENT_AMBULATORY_CARE_PROVIDER_SITE_OTHER): Payer: Medicare Other

## 2016-09-17 DIAGNOSIS — J309 Allergic rhinitis, unspecified: Secondary | ICD-10-CM | POA: Diagnosis not present

## 2016-09-24 ENCOUNTER — Ambulatory Visit (INDEPENDENT_AMBULATORY_CARE_PROVIDER_SITE_OTHER): Payer: Medicare Other

## 2016-09-24 DIAGNOSIS — J309 Allergic rhinitis, unspecified: Secondary | ICD-10-CM | POA: Diagnosis not present

## 2016-09-25 DIAGNOSIS — J301 Allergic rhinitis due to pollen: Secondary | ICD-10-CM

## 2016-09-26 DIAGNOSIS — J3081 Allergic rhinitis due to animal (cat) (dog) hair and dander: Secondary | ICD-10-CM

## 2016-10-01 ENCOUNTER — Ambulatory Visit (INDEPENDENT_AMBULATORY_CARE_PROVIDER_SITE_OTHER): Payer: Medicare Other

## 2016-10-01 DIAGNOSIS — J309 Allergic rhinitis, unspecified: Secondary | ICD-10-CM | POA: Diagnosis not present

## 2016-10-08 ENCOUNTER — Ambulatory Visit (INDEPENDENT_AMBULATORY_CARE_PROVIDER_SITE_OTHER): Payer: Medicare Other

## 2016-10-08 DIAGNOSIS — J309 Allergic rhinitis, unspecified: Secondary | ICD-10-CM

## 2016-10-15 ENCOUNTER — Ambulatory Visit (INDEPENDENT_AMBULATORY_CARE_PROVIDER_SITE_OTHER): Payer: Medicare Other

## 2016-10-15 DIAGNOSIS — J309 Allergic rhinitis, unspecified: Secondary | ICD-10-CM | POA: Diagnosis not present

## 2016-10-22 ENCOUNTER — Ambulatory Visit (INDEPENDENT_AMBULATORY_CARE_PROVIDER_SITE_OTHER): Payer: Medicare Other

## 2016-10-22 DIAGNOSIS — J309 Allergic rhinitis, unspecified: Secondary | ICD-10-CM | POA: Diagnosis not present

## 2016-10-29 ENCOUNTER — Ambulatory Visit (INDEPENDENT_AMBULATORY_CARE_PROVIDER_SITE_OTHER): Payer: Medicare Other

## 2016-10-29 DIAGNOSIS — J309 Allergic rhinitis, unspecified: Secondary | ICD-10-CM | POA: Diagnosis not present

## 2016-11-05 ENCOUNTER — Ambulatory Visit (INDEPENDENT_AMBULATORY_CARE_PROVIDER_SITE_OTHER): Payer: Medicare Other

## 2016-11-05 DIAGNOSIS — J309 Allergic rhinitis, unspecified: Secondary | ICD-10-CM

## 2016-11-12 ENCOUNTER — Ambulatory Visit (INDEPENDENT_AMBULATORY_CARE_PROVIDER_SITE_OTHER): Payer: Medicare Other

## 2016-11-12 DIAGNOSIS — J309 Allergic rhinitis, unspecified: Secondary | ICD-10-CM

## 2016-11-19 ENCOUNTER — Ambulatory Visit (INDEPENDENT_AMBULATORY_CARE_PROVIDER_SITE_OTHER): Payer: Medicare Other

## 2016-11-19 DIAGNOSIS — J309 Allergic rhinitis, unspecified: Secondary | ICD-10-CM

## 2016-11-26 ENCOUNTER — Ambulatory Visit (INDEPENDENT_AMBULATORY_CARE_PROVIDER_SITE_OTHER): Payer: Medicare Other

## 2016-11-26 DIAGNOSIS — J309 Allergic rhinitis, unspecified: Secondary | ICD-10-CM | POA: Diagnosis not present

## 2016-12-05 ENCOUNTER — Ambulatory Visit (INDEPENDENT_AMBULATORY_CARE_PROVIDER_SITE_OTHER): Payer: Medicare Other | Admitting: *Deleted

## 2016-12-05 DIAGNOSIS — J309 Allergic rhinitis, unspecified: Secondary | ICD-10-CM | POA: Diagnosis not present

## 2016-12-11 ENCOUNTER — Ambulatory Visit (INDEPENDENT_AMBULATORY_CARE_PROVIDER_SITE_OTHER): Payer: Medicare Other

## 2016-12-11 DIAGNOSIS — J309 Allergic rhinitis, unspecified: Secondary | ICD-10-CM | POA: Diagnosis not present

## 2016-12-13 DIAGNOSIS — J301 Allergic rhinitis due to pollen: Secondary | ICD-10-CM | POA: Diagnosis not present

## 2016-12-14 DIAGNOSIS — J3081 Allergic rhinitis due to animal (cat) (dog) hair and dander: Secondary | ICD-10-CM

## 2016-12-17 ENCOUNTER — Ambulatory Visit (INDEPENDENT_AMBULATORY_CARE_PROVIDER_SITE_OTHER): Payer: Medicare Other

## 2016-12-17 DIAGNOSIS — J309 Allergic rhinitis, unspecified: Secondary | ICD-10-CM

## 2016-12-20 NOTE — Addendum Note (Signed)
Addended by: Berna BueWHITAKER, Leahmarie Gasiorowski L on: 12/20/2016 08:46 AM   Modules accepted: Orders

## 2016-12-24 ENCOUNTER — Ambulatory Visit (INDEPENDENT_AMBULATORY_CARE_PROVIDER_SITE_OTHER): Payer: Medicare Other

## 2016-12-24 DIAGNOSIS — J309 Allergic rhinitis, unspecified: Secondary | ICD-10-CM | POA: Diagnosis not present

## 2016-12-31 ENCOUNTER — Ambulatory Visit (INDEPENDENT_AMBULATORY_CARE_PROVIDER_SITE_OTHER): Payer: Medicare Other

## 2016-12-31 DIAGNOSIS — J309 Allergic rhinitis, unspecified: Secondary | ICD-10-CM | POA: Diagnosis not present

## 2017-01-07 ENCOUNTER — Ambulatory Visit (INDEPENDENT_AMBULATORY_CARE_PROVIDER_SITE_OTHER): Payer: Medicare Other

## 2017-01-07 DIAGNOSIS — J309 Allergic rhinitis, unspecified: Secondary | ICD-10-CM | POA: Diagnosis not present

## 2017-01-14 ENCOUNTER — Ambulatory Visit (INDEPENDENT_AMBULATORY_CARE_PROVIDER_SITE_OTHER): Payer: Medicare Other

## 2017-01-14 DIAGNOSIS — J309 Allergic rhinitis, unspecified: Secondary | ICD-10-CM | POA: Diagnosis not present

## 2017-01-24 ENCOUNTER — Ambulatory Visit (INDEPENDENT_AMBULATORY_CARE_PROVIDER_SITE_OTHER): Payer: Medicare Other

## 2017-01-24 DIAGNOSIS — J309 Allergic rhinitis, unspecified: Secondary | ICD-10-CM

## 2017-02-04 ENCOUNTER — Ambulatory Visit (INDEPENDENT_AMBULATORY_CARE_PROVIDER_SITE_OTHER): Payer: Medicare Other

## 2017-02-04 DIAGNOSIS — J309 Allergic rhinitis, unspecified: Secondary | ICD-10-CM | POA: Diagnosis not present

## 2017-02-20 ENCOUNTER — Ambulatory Visit (INDEPENDENT_AMBULATORY_CARE_PROVIDER_SITE_OTHER): Payer: Medicare Other | Admitting: *Deleted

## 2017-02-20 DIAGNOSIS — J309 Allergic rhinitis, unspecified: Secondary | ICD-10-CM | POA: Diagnosis not present

## 2017-03-04 ENCOUNTER — Ambulatory Visit (INDEPENDENT_AMBULATORY_CARE_PROVIDER_SITE_OTHER): Payer: Medicare Other

## 2017-03-04 DIAGNOSIS — J309 Allergic rhinitis, unspecified: Secondary | ICD-10-CM

## 2017-03-19 ENCOUNTER — Ambulatory Visit (INDEPENDENT_AMBULATORY_CARE_PROVIDER_SITE_OTHER): Payer: Medicare Other

## 2017-03-19 DIAGNOSIS — J309 Allergic rhinitis, unspecified: Secondary | ICD-10-CM | POA: Diagnosis not present

## 2017-04-01 ENCOUNTER — Other Ambulatory Visit: Payer: Self-pay

## 2017-04-01 ENCOUNTER — Ambulatory Visit (INDEPENDENT_AMBULATORY_CARE_PROVIDER_SITE_OTHER): Payer: Medicare Other

## 2017-04-01 DIAGNOSIS — H1010 Acute atopic conjunctivitis, unspecified eye: Secondary | ICD-10-CM

## 2017-04-01 DIAGNOSIS — J309 Allergic rhinitis, unspecified: Secondary | ICD-10-CM

## 2017-04-01 MED ORDER — LEVOCETIRIZINE DIHYDROCHLORIDE 5 MG PO TABS: 5.0000 mg | ORAL_TABLET | Freq: Every evening | ORAL | 0 refills | Status: DC

## 2017-04-02 DIAGNOSIS — J301 Allergic rhinitis due to pollen: Secondary | ICD-10-CM

## 2017-04-03 DIAGNOSIS — J3089 Other allergic rhinitis: Secondary | ICD-10-CM

## 2017-04-15 ENCOUNTER — Ambulatory Visit (INDEPENDENT_AMBULATORY_CARE_PROVIDER_SITE_OTHER): Payer: Medicare Other

## 2017-04-15 DIAGNOSIS — J309 Allergic rhinitis, unspecified: Secondary | ICD-10-CM

## 2017-04-29 ENCOUNTER — Ambulatory Visit (INDEPENDENT_AMBULATORY_CARE_PROVIDER_SITE_OTHER): Payer: Medicare Other

## 2017-04-29 DIAGNOSIS — J309 Allergic rhinitis, unspecified: Secondary | ICD-10-CM

## 2017-05-08 ENCOUNTER — Encounter: Payer: Self-pay | Admitting: Allergy and Immunology

## 2017-05-08 ENCOUNTER — Ambulatory Visit (INDEPENDENT_AMBULATORY_CARE_PROVIDER_SITE_OTHER): Payer: Medicare Other | Admitting: Allergy and Immunology

## 2017-05-08 ENCOUNTER — Ambulatory Visit: Payer: Self-pay

## 2017-05-08 VITALS — BP 100/78 | HR 95 | Temp 98.8°F

## 2017-05-08 DIAGNOSIS — J309 Allergic rhinitis, unspecified: Secondary | ICD-10-CM

## 2017-05-08 DIAGNOSIS — J4520 Mild intermittent asthma, uncomplicated: Secondary | ICD-10-CM

## 2017-05-08 DIAGNOSIS — H1010 Acute atopic conjunctivitis, unspecified eye: Secondary | ICD-10-CM

## 2017-05-08 DIAGNOSIS — K200 Eosinophilic esophagitis: Secondary | ICD-10-CM | POA: Diagnosis not present

## 2017-05-08 NOTE — Progress Notes (Signed)
Follow-up Note  RE: Molly Frazier MRN: [id number] DOB: [birth year] Date of Office Visit: 05/08/2017  Primary care provider: No primary care provider on file. Referring provider: No ref. provider found  History of present illness: Molly Frazier is a 46 y.o. female allergic rhinoconjunctivitis, asthma, and history of eosinophilic esophagitis presented today for follow up.  She was last seen in this clinic in December 2016.  She states that in the interval since her previous visit her upper and lower rest or symptoms have been well-controlled.  She notes significant symptom reduction while on aeroallergen immunotherapy.  She is no longer taking montelukast or any other allergy/asthma medication with the exception of having access to albuterol if needed.  Over the past several months she has not required asthma rescue medication, experienced nocturnal awakenings due to lower respiratory symptoms, nor have activities of daily living been limited.  She has no nasal symptom complaints today.  She reports that her eosinophilic esophagitis has been well-controlled as long as she avoids certain foods, such as toast and crackers.   Assessment and plan: Mild intermittent asthma Well-controlled.  Continue albuterol HFA, 1-2 inhalations every 4-6 hours as needed.  Allergic rhinoconjunctivitis Well-controlled and stable.  Continue appropriate allergen avoidance measures, aeroallergen immunotherapy as prescribed and as tolerated, and nasal saline irrigation of needed.  Esophagitis, eosinophilic Controlled and stable with appropriate diet modification.  Follow up with gastroenterologist as directed.   Diagnostics: Spirometry:  Normal with an FEV1 of 121% predicted.  Please see scanned spirometry results for details.    Physical examination: Blood pressure 100/78, pulse 95, temperature 98.8 F (37.1 C), temperature source Oral, SpO2 96 %.  General: Alert, interactive, in no acute  distress. HEENT: TMs pearly gray, turbinates minimally edematous without discharge, post-pharynx mildly erythematous. Neck: Supple without lymphadenopathy. Lungs: Clear to auscultation without wheezing, rhonchi or rales. CV: Normal S1, S2 without murmurs. Skin: Warm and dry, without lesions or rashes.  The following portions of the patient's history were reviewed and updated as appropriate: allergies, current medications, past family history, past medical history, past social history, past surgical history and problem list.  Allergies as of 05/08/2017      Reactions   Wellbutrin [bupropion]    Other reaction(s): Other (See Comments) Pt has seizures   Benadryl [diphenhydramine Hcl] Rash   Oxycodone Nausea And Vomiting   Pregabalin Other (See Comments)   Seizure   Vicodin [hydrocodone-acetaminophen] Rash      Medication List       Accurate as of 05/08/17 12:40 PM. Always use your most recent med list.          albuterol 108 (90 Base) MCG/ACT inhaler Commonly known as:  PROAIR HFA Inhale 2 puffs into the lungs every 6 (six) hours as needed. Reported on 11/28/2015   carbamazepine 200 MG 12 hr capsule Commonly known as:  CARBATROL Take 200 mg by mouth 2 (two) times daily. Reported on 11/28/2015   CHANTIX PO Take 1 tablet by mouth 2 (two) times daily. Reported on 11/28/2015   clonazePAM 0.5 MG tablet Commonly known as:  KLONOPIN Take 0.5 mg by mouth 2 (two) times daily. Reported on 11/28/2015   cyclobenzaprine 10 MG tablet Commonly known as:  FLEXERIL Take 10 mg by mouth 3 (three) times daily. Reported on 11/28/2015   EPIPEN 2-PAK 0.3 mg/0.3 mL Soaj injection Generic drug:  EPINEPHrine Inject 0.3 mg into the muscle as needed.   fluticasone 50 MCG/ACT nasal spray Commonly known as:  FLONASE Place 2  sprays into both nostrils as needed for allergies or rhinitis. Reported on 11/28/2015   hydrOXYzine 25 MG capsule Commonly known as:  VISTARIL TAKE 1-2 TABLETS EVERY DAY AS  NEEDED FOR ANXIETY AND SLEEP ISSUES   levocetirizine 5 MG tablet Commonly known as:  XYZAL Take 1 tablet (5 mg total) by mouth every evening.   loxapine 50 MG capsule Commonly known as:  LOXITANE Take 50 mg by mouth 2 (two) times daily.   meloxicam 7.5 MG tablet Commonly known as:  MOBIC Take 7.5 mg by mouth daily. Reported on 11/28/2015   montelukast 10 MG tablet Commonly known as:  SINGULAIR Take 1 tablet (10 mg total) by mouth at bedtime.   omeprazole 40 MG capsule Commonly known as:  PRILOSEC Take 40 mg by mouth 2 (two) times daily. Reported on 11/28/2015   UNABLE TO FIND Med Name: immunotherapy       Allergies  Allergen Reactions  . Wellbutrin [Bupropion]     Other reaction(s): Other (See Comments) Pt has seizures  . Benadryl [Diphenhydramine Hcl] Rash  . Oxycodone Nausea And Vomiting  . Pregabalin Other (See Comments)    Seizure  . Vicodin [Hydrocodone-Acetaminophen] Rash    I appreciate the opportunity to take part in Sherin's care. Please do not hesitate to contact me with questions.  Sincerely,   R. Jorene Guestarter Maebel Marasco, MD

## 2017-05-08 NOTE — Assessment & Plan Note (Signed)
Well-controlled.  Continue albuterol HFA, 1-2 inhalations every 4-6 hours as needed. 

## 2017-05-08 NOTE — Patient Instructions (Signed)
Mild intermittent asthma Well-controlled.  Continue albuterol HFA, 1-2 inhalations every 4-6 hours as needed.  Allergic rhinoconjunctivitis Well-controlled and stable.  Continue appropriate allergen avoidance measures, aeroallergen immunotherapy as prescribed and as tolerated, and nasal saline irrigation of needed.  Esophagitis, eosinophilic Controlled and stable with appropriate diet modification.  Follow up with gastroenterologist as directed.   Return in about 1 year (around 05/08/2018), or if symptoms worsen or fail to improve.

## 2017-05-08 NOTE — Assessment & Plan Note (Signed)
Controlled and stable with appropriate diet modification.  Follow up with gastroenterologist as directed.

## 2017-05-08 NOTE — Assessment & Plan Note (Signed)
Well-controlled and stable.  Continue appropriate allergen avoidance measures, aeroallergen immunotherapy as prescribed and as tolerated, and nasal saline irrigation of needed.

## 2017-05-14 ENCOUNTER — Ambulatory Visit (INDEPENDENT_AMBULATORY_CARE_PROVIDER_SITE_OTHER): Payer: Medicare Other

## 2017-05-14 DIAGNOSIS — J309 Allergic rhinitis, unspecified: Secondary | ICD-10-CM

## 2017-05-20 ENCOUNTER — Ambulatory Visit (INDEPENDENT_AMBULATORY_CARE_PROVIDER_SITE_OTHER): Payer: Medicare Other

## 2017-05-20 DIAGNOSIS — J309 Allergic rhinitis, unspecified: Secondary | ICD-10-CM | POA: Diagnosis not present

## 2017-05-27 ENCOUNTER — Ambulatory Visit (INDEPENDENT_AMBULATORY_CARE_PROVIDER_SITE_OTHER): Payer: Medicare Other | Admitting: *Deleted

## 2017-05-27 DIAGNOSIS — J309 Allergic rhinitis, unspecified: Secondary | ICD-10-CM | POA: Diagnosis not present

## 2017-06-10 ENCOUNTER — Ambulatory Visit (INDEPENDENT_AMBULATORY_CARE_PROVIDER_SITE_OTHER): Payer: Medicare Other

## 2017-06-10 DIAGNOSIS — J309 Allergic rhinitis, unspecified: Secondary | ICD-10-CM | POA: Diagnosis not present

## 2017-06-24 ENCOUNTER — Ambulatory Visit (INDEPENDENT_AMBULATORY_CARE_PROVIDER_SITE_OTHER): Payer: Medicare Other

## 2017-06-24 DIAGNOSIS — J309 Allergic rhinitis, unspecified: Secondary | ICD-10-CM | POA: Diagnosis not present

## 2017-07-09 ENCOUNTER — Ambulatory Visit (INDEPENDENT_AMBULATORY_CARE_PROVIDER_SITE_OTHER): Payer: Medicare Other | Admitting: *Deleted

## 2017-07-09 DIAGNOSIS — J309 Allergic rhinitis, unspecified: Secondary | ICD-10-CM | POA: Diagnosis not present

## 2017-07-15 DIAGNOSIS — J3089 Other allergic rhinitis: Secondary | ICD-10-CM | POA: Diagnosis not present

## 2017-07-15 NOTE — Progress Notes (Signed)
VIALS EXP 07-16-18 

## 2017-07-16 DIAGNOSIS — J301 Allergic rhinitis due to pollen: Secondary | ICD-10-CM

## 2017-07-22 ENCOUNTER — Ambulatory Visit (INDEPENDENT_AMBULATORY_CARE_PROVIDER_SITE_OTHER): Payer: Medicare Other | Admitting: *Deleted

## 2017-07-22 DIAGNOSIS — J309 Allergic rhinitis, unspecified: Secondary | ICD-10-CM

## 2017-08-05 ENCOUNTER — Ambulatory Visit (INDEPENDENT_AMBULATORY_CARE_PROVIDER_SITE_OTHER): Payer: Medicare Other

## 2017-08-05 DIAGNOSIS — J309 Allergic rhinitis, unspecified: Secondary | ICD-10-CM | POA: Diagnosis not present

## 2017-08-22 ENCOUNTER — Ambulatory Visit (INDEPENDENT_AMBULATORY_CARE_PROVIDER_SITE_OTHER): Payer: Medicare Other | Admitting: *Deleted

## 2017-08-22 DIAGNOSIS — J309 Allergic rhinitis, unspecified: Secondary | ICD-10-CM | POA: Diagnosis not present

## 2017-08-26 ENCOUNTER — Other Ambulatory Visit: Payer: Self-pay

## 2017-08-26 ENCOUNTER — Ambulatory Visit (INDEPENDENT_AMBULATORY_CARE_PROVIDER_SITE_OTHER): Payer: Medicare Other | Admitting: *Deleted

## 2017-08-26 DIAGNOSIS — J309 Allergic rhinitis, unspecified: Secondary | ICD-10-CM

## 2017-08-26 MED ORDER — EPINEPHRINE 0.3 MG/0.3ML IJ SOAJ: 0.3000 mg | INTRAMUSCULAR | 1 refills | Status: DC | PRN

## 2017-09-02 ENCOUNTER — Ambulatory Visit (INDEPENDENT_AMBULATORY_CARE_PROVIDER_SITE_OTHER): Payer: Medicare Other

## 2017-09-02 DIAGNOSIS — J309 Allergic rhinitis, unspecified: Secondary | ICD-10-CM

## 2017-09-09 ENCOUNTER — Ambulatory Visit (INDEPENDENT_AMBULATORY_CARE_PROVIDER_SITE_OTHER): Payer: Medicare Other

## 2017-09-09 DIAGNOSIS — J309 Allergic rhinitis, unspecified: Secondary | ICD-10-CM | POA: Diagnosis not present

## 2017-09-16 ENCOUNTER — Ambulatory Visit (INDEPENDENT_AMBULATORY_CARE_PROVIDER_SITE_OTHER): Payer: Medicare Other

## 2017-09-16 DIAGNOSIS — J309 Allergic rhinitis, unspecified: Secondary | ICD-10-CM | POA: Diagnosis not present

## 2017-09-23 ENCOUNTER — Ambulatory Visit (INDEPENDENT_AMBULATORY_CARE_PROVIDER_SITE_OTHER): Payer: Medicare Other

## 2017-09-23 DIAGNOSIS — J309 Allergic rhinitis, unspecified: Secondary | ICD-10-CM | POA: Diagnosis not present

## 2017-09-30 ENCOUNTER — Ambulatory Visit (INDEPENDENT_AMBULATORY_CARE_PROVIDER_SITE_OTHER): Payer: Medicare Other

## 2017-09-30 DIAGNOSIS — J309 Allergic rhinitis, unspecified: Secondary | ICD-10-CM | POA: Diagnosis not present

## 2017-10-08 ENCOUNTER — Ambulatory Visit (INDEPENDENT_AMBULATORY_CARE_PROVIDER_SITE_OTHER): Payer: Medicare Other | Admitting: *Deleted

## 2017-10-08 DIAGNOSIS — J309 Allergic rhinitis, unspecified: Secondary | ICD-10-CM | POA: Diagnosis not present

## 2017-10-14 ENCOUNTER — Ambulatory Visit (INDEPENDENT_AMBULATORY_CARE_PROVIDER_SITE_OTHER): Payer: Medicare Other

## 2017-10-14 DIAGNOSIS — J309 Allergic rhinitis, unspecified: Secondary | ICD-10-CM

## 2017-10-23 ENCOUNTER — Ambulatory Visit (INDEPENDENT_AMBULATORY_CARE_PROVIDER_SITE_OTHER): Payer: Medicare Other

## 2017-10-23 DIAGNOSIS — J309 Allergic rhinitis, unspecified: Secondary | ICD-10-CM

## 2017-10-28 ENCOUNTER — Ambulatory Visit (INDEPENDENT_AMBULATORY_CARE_PROVIDER_SITE_OTHER): Payer: Medicare Other

## 2017-10-28 DIAGNOSIS — J309 Allergic rhinitis, unspecified: Secondary | ICD-10-CM

## 2017-10-29 ENCOUNTER — Ambulatory Visit: Payer: Medicare Other | Admitting: Allergy and Immunology

## 2017-11-07 ENCOUNTER — Ambulatory Visit (INDEPENDENT_AMBULATORY_CARE_PROVIDER_SITE_OTHER): Payer: Medicare Other | Admitting: *Deleted

## 2017-11-07 DIAGNOSIS — J309 Allergic rhinitis, unspecified: Secondary | ICD-10-CM

## 2017-11-11 DIAGNOSIS — J301 Allergic rhinitis due to pollen: Secondary | ICD-10-CM | POA: Diagnosis not present

## 2017-11-12 DIAGNOSIS — J301 Allergic rhinitis due to pollen: Secondary | ICD-10-CM | POA: Diagnosis not present

## 2017-11-12 NOTE — Progress Notes (Signed)
EXP 11/13/18 

## 2017-11-20 ENCOUNTER — Ambulatory Visit (INDEPENDENT_AMBULATORY_CARE_PROVIDER_SITE_OTHER): Payer: Medicare Other

## 2017-11-20 DIAGNOSIS — J309 Allergic rhinitis, unspecified: Secondary | ICD-10-CM | POA: Diagnosis not present

## 2017-11-26 ENCOUNTER — Ambulatory Visit (INDEPENDENT_AMBULATORY_CARE_PROVIDER_SITE_OTHER): Payer: Medicare Other | Admitting: *Deleted

## 2017-11-26 DIAGNOSIS — J309 Allergic rhinitis, unspecified: Secondary | ICD-10-CM

## 2017-12-04 ENCOUNTER — Ambulatory Visit (INDEPENDENT_AMBULATORY_CARE_PROVIDER_SITE_OTHER): Payer: Medicare Other

## 2017-12-04 DIAGNOSIS — J309 Allergic rhinitis, unspecified: Secondary | ICD-10-CM | POA: Diagnosis not present

## 2017-12-11 ENCOUNTER — Ambulatory Visit (INDEPENDENT_AMBULATORY_CARE_PROVIDER_SITE_OTHER): Payer: Medicare Other | Admitting: *Deleted

## 2017-12-11 DIAGNOSIS — J309 Allergic rhinitis, unspecified: Secondary | ICD-10-CM | POA: Diagnosis not present

## 2017-12-18 ENCOUNTER — Ambulatory Visit: Payer: Medicare Other | Admitting: Allergy and Immunology

## 2017-12-23 ENCOUNTER — Ambulatory Visit (INDEPENDENT_AMBULATORY_CARE_PROVIDER_SITE_OTHER): Payer: Medicare Other

## 2017-12-23 DIAGNOSIS — J309 Allergic rhinitis, unspecified: Secondary | ICD-10-CM | POA: Diagnosis not present

## 2017-12-23 NOTE — Progress Notes (Signed)
Immunotherapy   Patient Details  Name: Molly GriceDiedrah Frazier MRN: [id number] Date of Birth: 02/27/1971  12/23/2017   Molly Frazier  Pt. On red vials keeping pt. At 0.40 for maintenance bc when she gets to 0.50 her reactions get "huge" per pt. Pt. Leaves after injections. Following schedule: C  Frequency:1 time per week Epi-Pen:Epi-Pen Available  Consent signed and patient instructions given.   Murray HodgkinsMichelle Khaylee Mcevoy 12/23/2017, 9:53 AM

## 2017-12-30 ENCOUNTER — Ambulatory Visit (INDEPENDENT_AMBULATORY_CARE_PROVIDER_SITE_OTHER): Payer: Medicare Other | Admitting: *Deleted

## 2017-12-30 DIAGNOSIS — J309 Allergic rhinitis, unspecified: Secondary | ICD-10-CM

## 2018-01-15 ENCOUNTER — Ambulatory Visit (INDEPENDENT_AMBULATORY_CARE_PROVIDER_SITE_OTHER): Payer: Medicare Other

## 2018-01-15 DIAGNOSIS — J309 Allergic rhinitis, unspecified: Secondary | ICD-10-CM

## 2018-01-21 NOTE — Progress Notes (Signed)
VIALS EXP 01-22-19 

## 2018-01-22 DIAGNOSIS — J301 Allergic rhinitis due to pollen: Secondary | ICD-10-CM | POA: Diagnosis not present

## 2018-01-23 DIAGNOSIS — J3081 Allergic rhinitis due to animal (cat) (dog) hair and dander: Secondary | ICD-10-CM | POA: Diagnosis not present

## 2018-01-27 ENCOUNTER — Ambulatory Visit (INDEPENDENT_AMBULATORY_CARE_PROVIDER_SITE_OTHER): Payer: Medicare Other

## 2018-01-27 DIAGNOSIS — J309 Allergic rhinitis, unspecified: Secondary | ICD-10-CM | POA: Diagnosis not present

## 2018-02-10 ENCOUNTER — Ambulatory Visit (INDEPENDENT_AMBULATORY_CARE_PROVIDER_SITE_OTHER): Payer: Medicare Other

## 2018-02-10 DIAGNOSIS — J309 Allergic rhinitis, unspecified: Secondary | ICD-10-CM

## 2018-02-12 ENCOUNTER — Encounter: Payer: Self-pay | Admitting: Allergy and Immunology

## 2018-02-12 ENCOUNTER — Ambulatory Visit (INDEPENDENT_AMBULATORY_CARE_PROVIDER_SITE_OTHER): Payer: Medicare Other | Admitting: Allergy and Immunology

## 2018-02-12 VITALS — BP 110/80 | HR 96 | Temp 98.5°F | Resp 20

## 2018-02-12 DIAGNOSIS — J4520 Mild intermittent asthma, uncomplicated: Secondary | ICD-10-CM | POA: Diagnosis not present

## 2018-02-12 DIAGNOSIS — H1010 Acute atopic conjunctivitis, unspecified eye: Secondary | ICD-10-CM | POA: Diagnosis not present

## 2018-02-12 DIAGNOSIS — J309 Allergic rhinitis, unspecified: Secondary | ICD-10-CM | POA: Diagnosis not present

## 2018-02-12 MED ORDER — EPINEPHRINE 0.3 MG/0.3ML IJ SOAJ: INTRAMUSCULAR | 1 refills | Status: DC

## 2018-02-12 NOTE — Assessment & Plan Note (Addendum)
Well-controlled.  Continue albuterol HFA, 1-2 inhalations every 4-6 hours as needed.  Subjective and objective measures of pulmonary function will be followed and the treatment plan will be adjusted accordingly. 

## 2018-02-12 NOTE — Assessment & Plan Note (Addendum)
Stable.  Continue appropriate allergen avoidance measures, aeroallergen immunotherapy as prescribed and as tolerated, and nasal saline irrigation of needed.

## 2018-02-12 NOTE — Patient Instructions (Signed)
Mild intermittent asthma Well-controlled.  Continue albuterol HFA, 1-2 inhalations every 4-6 hours as needed.  Subjective and objective measures of pulmonary function will be followed and the treatment plan will be adjusted accordingly.  Allergic rhinoconjunctivitis Stable.  Continue appropriate allergen avoidance measures, aeroallergen immunotherapy as prescribed and as tolerated, and nasal saline irrigation of needed.   Return in about 6 months (around 08/15/2018), or if symptoms worsen or fail to improve.

## 2018-02-12 NOTE — Progress Notes (Signed)
Follow-up Note  RE: Molly Frazier MRN: [id number] DOB: [birth year] Date of Office Visit: 02/12/2018  Primary care provider: [facility] Referring provider: [facility]  History of present illness: Molly Frazier is a 47 y.o. female with allergic rhinoconjunctivitis on immunotherapy, asthma, and history of eosinophilic esophagitis presenting today for follow-up.  She was last seen in this clinic in May 2018.  She reports that her upper and lower respiratory symptoms are well controlled.  She rarely requires albuterol rescue and denies limitations in daily activities and nocturnal awakenings due to lower respiratory symptoms.  Her nasal symptoms are currently well controlled.  She has been receiving aeroallergen immunotherapy injections every 2 weeks.  She apparently had been having delayed large local reactions after the injections, however she states that her immunotherapy dose has been adjusted accordingly.  She has no complaints today.   Assessment and plan: Mild intermittent asthma Well-controlled.  Continue albuterol HFA, 1-2 inhalations every 4-6 hours as needed.  Subjective and objective measures of pulmonary function will be followed and the treatment plan will be adjusted accordingly.  Allergic rhinoconjunctivitis Stable.  Continue appropriate allergen avoidance measures, aeroallergen immunotherapy as prescribed and as tolerated, and nasal saline irrigation of needed.   Meds ordered this encounter  Medications  . EPINEPHrine (EPIPEN 2-PAK) 0.3 mg/0.3 mL IJ SOAJ injection    Sig: Use as directed for severe allergic reactions    Dispense:  2 Device    Refill:  1    Diagnostics: Spirometry:  Normal with an FEV1 of 109% predicted.  Please see scanned spirometry results for details.    Physical examination: Blood pressure 110/80, pulse 96, temperature 98.5 F (36.9 C), temperature source Oral, resp. rate 20, SpO2 96 %.  General: Alert,  interactive, in no acute distress. HEENT: TMs pearly gray, turbinates mildly edematous without discharge, post-pharynx moderately erythematous. Neck: Supple without lymphadenopathy. Lungs: Clear to auscultation without wheezing, rhonchi or rales. CV: Normal S1, S2 without murmurs. Skin: Warm and dry, without lesions or rashes.  The following portions of the patient's history were reviewed and updated as appropriate: allergies, current medications, past family history, past medical history, past social history, past surgical history and problem list.  Allergies as of 02/12/2018      Reactions   Wellbutrin [bupropion]    Other reaction(s): Other (See Comments) Pt has seizures   Benadryl [diphenhydramine Hcl] Rash   Oxycodone Nausea And Vomiting   Pregabalin Other (See Comments)   Seizure   Vicodin [hydrocodone-acetaminophen] Rash      Medication List        Accurate as of 02/12/18 12:02 PM. Always use your most recent med list.          albuterol 108 (90 Base) MCG/ACT inhaler Commonly known as:  PROAIR HFA Inhale 2 puffs into the lungs every 6 (six) hours as needed. Reported on 11/28/2015   carbamazepine 200 MG 12 hr capsule Commonly known as:  CARBATROL Take 200 mg by mouth 2 (two) times daily. Reported on 11/28/2015   CHANTIX PO Take 1 tablet by mouth 2 (two) times daily. Reported on 11/28/2015   clonazePAM 0.5 MG tablet Commonly known as:  KLONOPIN Take 0.5 mg by mouth 2 (two) times daily. Reported on 11/28/2015   cyclobenzaprine 10 MG tablet Commonly known as:  FLEXERIL Take 10 mg by mouth 3 (three) times daily. Reported on 11/28/2015   EPINEPHrine 0.3 mg/0.3 mL Soaj injection Commonly known as:  EPIPEN 2-PAK Use as directed for severe  allergic reactions   fluticasone 50 MCG/ACT nasal spray Commonly known as:  FLONASE Place 2 sprays into both nostrils as needed for allergies or rhinitis. Reported on 11/28/2015   hydrOXYzine 25 MG capsule Commonly known as:   VISTARIL TAKE 1-2 TABLETS EVERY DAY AS NEEDED FOR ANXIETY AND SLEEP ISSUES   levocetirizine 5 MG tablet Commonly known as:  XYZAL Take 1 tablet (5 mg total) by mouth every evening.   loxapine 50 MG capsule Commonly known as:  LOXITANE Take 50 mg by mouth 2 (two) times daily.   meloxicam 7.5 MG tablet Commonly known as:  MOBIC Take 7.5 mg by mouth daily. Reported on 11/28/2015   montelukast 10 MG tablet Commonly known as:  SINGULAIR Take 1 tablet (10 mg total) by mouth at bedtime.   omeprazole 40 MG capsule Commonly known as:  PRILOSEC Take 40 mg by mouth 2 (two) times daily. Reported on 11/28/2015   traMADol 50 MG tablet Commonly known as:  ULTRAM Take 50 mg by mouth 3 (three) times daily as needed.   UNABLE TO FIND Med Name: immunotherapy       Allergies  Allergen Reactions  . Wellbutrin [Bupropion]     Other reaction(s): Other (See Comments) Pt has seizures  . Benadryl [Diphenhydramine Hcl] Rash  . Oxycodone Nausea And Vomiting  . Pregabalin Other (See Comments)    Seizure  . Vicodin [Hydrocodone-Acetaminophen] Rash    I appreciate the opportunity to take part in Molly Frazier's care. Please do not hesitate to contact me with questions.  Sincerely,   R. Jorene Guestarter Wilver Tignor, MD

## 2018-02-24 ENCOUNTER — Ambulatory Visit (INDEPENDENT_AMBULATORY_CARE_PROVIDER_SITE_OTHER): Payer: Medicare Other

## 2018-02-24 DIAGNOSIS — J309 Allergic rhinitis, unspecified: Secondary | ICD-10-CM

## 2018-03-10 ENCOUNTER — Ambulatory Visit (INDEPENDENT_AMBULATORY_CARE_PROVIDER_SITE_OTHER): Payer: Medicare Other | Admitting: *Deleted

## 2018-03-10 DIAGNOSIS — J309 Allergic rhinitis, unspecified: Secondary | ICD-10-CM | POA: Diagnosis not present

## 2018-03-25 ENCOUNTER — Ambulatory Visit (INDEPENDENT_AMBULATORY_CARE_PROVIDER_SITE_OTHER): Payer: Medicare Other

## 2018-03-25 DIAGNOSIS — J309 Allergic rhinitis, unspecified: Secondary | ICD-10-CM

## 2018-03-31 ENCOUNTER — Ambulatory Visit (INDEPENDENT_AMBULATORY_CARE_PROVIDER_SITE_OTHER): Payer: Medicare Other

## 2018-03-31 DIAGNOSIS — J309 Allergic rhinitis, unspecified: Secondary | ICD-10-CM

## 2018-04-07 ENCOUNTER — Ambulatory Visit (INDEPENDENT_AMBULATORY_CARE_PROVIDER_SITE_OTHER): Payer: Medicare Other

## 2018-04-07 DIAGNOSIS — J309 Allergic rhinitis, unspecified: Secondary | ICD-10-CM

## 2018-04-14 ENCOUNTER — Ambulatory Visit (INDEPENDENT_AMBULATORY_CARE_PROVIDER_SITE_OTHER): Payer: Medicare Other

## 2018-04-14 DIAGNOSIS — J309 Allergic rhinitis, unspecified: Secondary | ICD-10-CM

## 2018-04-29 ENCOUNTER — Ambulatory Visit (INDEPENDENT_AMBULATORY_CARE_PROVIDER_SITE_OTHER): Payer: Medicare Other

## 2018-04-29 DIAGNOSIS — J309 Allergic rhinitis, unspecified: Secondary | ICD-10-CM

## 2018-05-13 ENCOUNTER — Ambulatory Visit (INDEPENDENT_AMBULATORY_CARE_PROVIDER_SITE_OTHER): Payer: Medicare Other

## 2018-05-13 DIAGNOSIS — J309 Allergic rhinitis, unspecified: Secondary | ICD-10-CM | POA: Diagnosis not present

## 2018-05-28 ENCOUNTER — Ambulatory Visit (INDEPENDENT_AMBULATORY_CARE_PROVIDER_SITE_OTHER): Payer: Medicare Other

## 2018-05-28 DIAGNOSIS — J309 Allergic rhinitis, unspecified: Secondary | ICD-10-CM | POA: Diagnosis not present

## 2018-06-04 ENCOUNTER — Encounter: Payer: Self-pay | Admitting: *Deleted

## 2018-06-04 NOTE — Progress Notes (Signed)
Maintenance vial made. Exp: 06-05-19. hv 

## 2018-06-05 DIAGNOSIS — J3089 Other allergic rhinitis: Secondary | ICD-10-CM | POA: Diagnosis not present

## 2018-06-06 DIAGNOSIS — J301 Allergic rhinitis due to pollen: Secondary | ICD-10-CM

## 2018-06-09 ENCOUNTER — Ambulatory Visit (INDEPENDENT_AMBULATORY_CARE_PROVIDER_SITE_OTHER): Payer: Medicare Other

## 2018-06-09 DIAGNOSIS — J309 Allergic rhinitis, unspecified: Secondary | ICD-10-CM | POA: Diagnosis not present

## 2018-06-23 ENCOUNTER — Ambulatory Visit (INDEPENDENT_AMBULATORY_CARE_PROVIDER_SITE_OTHER): Payer: Medicare Other | Admitting: *Deleted

## 2018-06-23 DIAGNOSIS — J309 Allergic rhinitis, unspecified: Secondary | ICD-10-CM | POA: Diagnosis not present

## 2018-07-08 ENCOUNTER — Ambulatory Visit (INDEPENDENT_AMBULATORY_CARE_PROVIDER_SITE_OTHER): Payer: Medicare Other

## 2018-07-08 DIAGNOSIS — J309 Allergic rhinitis, unspecified: Secondary | ICD-10-CM

## 2018-07-21 ENCOUNTER — Ambulatory Visit (INDEPENDENT_AMBULATORY_CARE_PROVIDER_SITE_OTHER): Payer: Medicare Other | Admitting: *Deleted

## 2018-07-21 DIAGNOSIS — J309 Allergic rhinitis, unspecified: Secondary | ICD-10-CM

## 2018-07-28 ENCOUNTER — Ambulatory Visit (INDEPENDENT_AMBULATORY_CARE_PROVIDER_SITE_OTHER): Payer: Medicare Other | Admitting: *Deleted

## 2018-07-28 DIAGNOSIS — J309 Allergic rhinitis, unspecified: Secondary | ICD-10-CM | POA: Diagnosis not present

## 2018-08-04 ENCOUNTER — Ambulatory Visit (INDEPENDENT_AMBULATORY_CARE_PROVIDER_SITE_OTHER): Payer: Medicare Other

## 2018-08-04 DIAGNOSIS — J309 Allergic rhinitis, unspecified: Secondary | ICD-10-CM | POA: Diagnosis not present

## 2018-08-12 ENCOUNTER — Ambulatory Visit (INDEPENDENT_AMBULATORY_CARE_PROVIDER_SITE_OTHER): Payer: Medicare Other | Admitting: *Deleted

## 2018-08-12 DIAGNOSIS — J309 Allergic rhinitis, unspecified: Secondary | ICD-10-CM | POA: Diagnosis not present

## 2018-08-21 ENCOUNTER — Ambulatory Visit (INDEPENDENT_AMBULATORY_CARE_PROVIDER_SITE_OTHER): Payer: Medicare Other | Admitting: Allergy and Immunology

## 2018-08-21 ENCOUNTER — Other Ambulatory Visit: Payer: Self-pay | Admitting: Allergy and Immunology

## 2018-08-21 ENCOUNTER — Encounter: Payer: Self-pay | Admitting: Allergy and Immunology

## 2018-08-21 VITALS — BP 100/70 | HR 94 | Temp 97.8°F | Resp 20

## 2018-08-21 DIAGNOSIS — H1010 Acute atopic conjunctivitis, unspecified eye: Secondary | ICD-10-CM

## 2018-08-21 DIAGNOSIS — J4520 Mild intermittent asthma, uncomplicated: Secondary | ICD-10-CM

## 2018-08-21 DIAGNOSIS — J309 Allergic rhinitis, unspecified: Secondary | ICD-10-CM | POA: Diagnosis not present

## 2018-08-21 MED ORDER — EPINEPHRINE 0.3 MG/0.3ML IJ SOAJ: INTRAMUSCULAR | 1 refills | Status: DC

## 2018-08-21 NOTE — Assessment & Plan Note (Signed)
Well-controlled.  Continue appropriate allergen avoidance measures, aeroallergen immunotherapy as prescribed and as tolerated, and nasal saline irrigation of needed.

## 2018-08-21 NOTE — Progress Notes (Signed)
Follow-up Note  RE: Molly Frazier MRN: [id number] DOB: Sep 01, 1971 Date of Office Visit: 08/21/2018  Primary care provider: [facility] Referring provider: [facility]  History of present illness: Molly Frazier is a 47 y.o. female with intermittent asthma, allergic rhinoconjunctivitis on immunotherapy, and history of eosinophilic esophagitis presenting today for follow-up.  She was last seen in this clinic on February 12, 2018.  She reports that she is doing well with aeroallergen immunotherapy injections.  She has recently transitioned from build up to maintenance injections, receiving them every 2 weeks.  She reports that her nasal allergy symptoms are well controlled and she is not requiring any medications for the allergies.  She is followed by Dr. Eulis Foster for her asthma and is currently using Arnuity Ellipta as needed.  Assessment and plan: Allergic rhinoconjunctivitis Well-controlled.  Continue appropriate allergen avoidance measures, aeroallergen immunotherapy as prescribed and as tolerated, and nasal saline irrigation of needed.  Mild intermittent asthma Stable.  Continue Arnuity Ellipta as prescribed by Dr. Eulis Foster and follow with him as directed.   Meds ordered this encounter  Medications  . EPINEPHrine (EPIPEN 2-PAK) 0.3 mg/0.3 mL IJ SOAJ injection    Sig: Use as directed for severe allergic reactions    Dispense:  2 Device    Refill:  1    Diagnostics: Spirometry:  Normal with an FEV1 of 107% predicted.  Please see scanned spirometry results for details.    Physical examination: Blood pressure 100/70, pulse 94, temperature 97.8 F (36.6 C), temperature source Oral, resp. rate 20, SpO2 97 %.  General: Alert, interactive, in no acute distress. HEENT: TMs pearly gray, turbinates minimally edematous without discharge, post-pharynx moderately erythematous. Neck: Supple without lymphadenopathy. Lungs: Clear to auscultation without wheezing,  rhonchi or rales. CV: Normal S1, S2 without murmurs. Skin: Warm and dry, without lesions or rashes.  The following portions of the patient's history were reviewed and updated as appropriate: allergies, current medications, past family history, past medical history, past social history, past surgical history and problem list.  Allergies as of 08/21/2018      Reactions   Wellbutrin [bupropion]    Other reaction(s): Other (See Comments) Pt has seizures   Benadryl [diphenhydramine Hcl] Rash   Oxycodone Nausea And Vomiting   Pregabalin Other (See Comments)   Seizure   Vicodin [hydrocodone-acetaminophen] Rash      Medication List        Accurate as of 08/21/18 10:38 AM. Always use your most recent med list.          amitriptyline 50 MG tablet Commonly known as:  ELAVIL TAKE 1 TABLET BY MOUTH EVERYDAY AT BEDTIME   ARNUITY ELLIPTA 100 MCG/ACT Aepb Generic drug:  Fluticasone Furoate INHALE 1 PUFF BY MOUTH EVERY DAY   carbamazepine 200 MG 12 hr capsule Commonly known as:  CARBATROL Take 200 mg by mouth 2 (two) times daily. Reported on 11/28/2015   ciclopirox 8 % solution Commonly known as:  PENLAC   clonazePAM 0.5 MG tablet Commonly known as:  KLONOPIN Take 0.5 mg by mouth 2 (two) times daily. Reported on 11/28/2015   EPINEPHrine 0.3 mg/0.3 mL Soaj injection Commonly known as:  EPI-PEN Use as directed for severe allergic reactions   loxapine 50 MG capsule Commonly known as:  LOXITANE Take 50 mg by mouth 2 (two) times daily.   NON FORMULARY   omeprazole 40 MG capsule Commonly known as:  PRILOSEC Take 40 mg by mouth 2 (two) times daily. Reported on 11/28/2015  Allergies  Allergen Reactions  . Wellbutrin [Bupropion]     Other reaction(s): Other (See Comments) Pt has seizures  . Benadryl [Diphenhydramine Hcl] Rash  . Oxycodone Nausea And Vomiting  . Pregabalin Other (See Comments)    Seizure  . Vicodin [Hydrocodone-Acetaminophen] Rash    I appreciate the  opportunity to take part in Seniya's care. Please do not hesitate to contact me with questions.  Sincerely,   R. Jorene Guestarter Paislie Tessler, MD

## 2018-08-21 NOTE — Assessment & Plan Note (Signed)
Stable.  Continue Arnuity Ellipta as prescribed by Dr. Eulis FosterBeauford and follow with him as directed.

## 2018-08-21 NOTE — Patient Instructions (Addendum)
Allergic rhinoconjunctivitis Well-controlled.  Continue appropriate allergen avoidance measures, aeroallergen immunotherapy as prescribed and as tolerated, and nasal saline irrigation of needed.  Mild intermittent asthma Stable.  Continue Arnuity Ellipta as prescribed by Dr. Eulis FosterBeauford and follow with him as directed.   Return in about 1 year (around 08/22/2019), or if symptoms worsen or fail to improve.

## 2018-08-26 ENCOUNTER — Ambulatory Visit (INDEPENDENT_AMBULATORY_CARE_PROVIDER_SITE_OTHER): Payer: Medicare Other | Admitting: *Deleted

## 2018-08-26 DIAGNOSIS — J309 Allergic rhinitis, unspecified: Secondary | ICD-10-CM | POA: Diagnosis not present

## 2018-09-08 ENCOUNTER — Ambulatory Visit (INDEPENDENT_AMBULATORY_CARE_PROVIDER_SITE_OTHER): Payer: Medicare Other

## 2018-09-08 DIAGNOSIS — J309 Allergic rhinitis, unspecified: Secondary | ICD-10-CM | POA: Diagnosis not present

## 2018-09-23 ENCOUNTER — Ambulatory Visit (INDEPENDENT_AMBULATORY_CARE_PROVIDER_SITE_OTHER): Payer: Medicare Other

## 2018-09-23 DIAGNOSIS — J309 Allergic rhinitis, unspecified: Secondary | ICD-10-CM

## 2018-09-29 NOTE — Progress Notes (Signed)
EXP 09/30/19 

## 2018-09-30 DIAGNOSIS — J301 Allergic rhinitis due to pollen: Secondary | ICD-10-CM

## 2018-10-01 DIAGNOSIS — J3081 Allergic rhinitis due to animal (cat) (dog) hair and dander: Secondary | ICD-10-CM | POA: Diagnosis not present

## 2018-10-07 ENCOUNTER — Ambulatory Visit (INDEPENDENT_AMBULATORY_CARE_PROVIDER_SITE_OTHER): Payer: Medicare Other

## 2018-10-07 DIAGNOSIS — J309 Allergic rhinitis, unspecified: Secondary | ICD-10-CM | POA: Diagnosis not present

## 2018-10-20 ENCOUNTER — Ambulatory Visit (INDEPENDENT_AMBULATORY_CARE_PROVIDER_SITE_OTHER): Payer: Medicare Other

## 2018-10-20 DIAGNOSIS — J309 Allergic rhinitis, unspecified: Secondary | ICD-10-CM

## 2018-11-04 ENCOUNTER — Ambulatory Visit (INDEPENDENT_AMBULATORY_CARE_PROVIDER_SITE_OTHER): Payer: Medicare Other

## 2018-11-04 DIAGNOSIS — J309 Allergic rhinitis, unspecified: Secondary | ICD-10-CM

## 2018-11-10 ENCOUNTER — Ambulatory Visit (INDEPENDENT_AMBULATORY_CARE_PROVIDER_SITE_OTHER): Payer: Medicare Other | Admitting: *Deleted

## 2018-11-10 DIAGNOSIS — J309 Allergic rhinitis, unspecified: Secondary | ICD-10-CM

## 2018-11-18 ENCOUNTER — Ambulatory Visit (INDEPENDENT_AMBULATORY_CARE_PROVIDER_SITE_OTHER): Payer: Medicare Other

## 2018-11-18 DIAGNOSIS — J309 Allergic rhinitis, unspecified: Secondary | ICD-10-CM

## 2018-12-08 ENCOUNTER — Ambulatory Visit (INDEPENDENT_AMBULATORY_CARE_PROVIDER_SITE_OTHER): Payer: Medicare Other

## 2018-12-08 DIAGNOSIS — J309 Allergic rhinitis, unspecified: Secondary | ICD-10-CM

## 2018-12-15 ENCOUNTER — Ambulatory Visit (INDEPENDENT_AMBULATORY_CARE_PROVIDER_SITE_OTHER): Payer: Medicare Other

## 2018-12-15 DIAGNOSIS — J309 Allergic rhinitis, unspecified: Secondary | ICD-10-CM

## 2018-12-22 ENCOUNTER — Ambulatory Visit (INDEPENDENT_AMBULATORY_CARE_PROVIDER_SITE_OTHER): Payer: Medicare Other

## 2018-12-22 DIAGNOSIS — J309 Allergic rhinitis, unspecified: Secondary | ICD-10-CM

## 2018-12-30 ENCOUNTER — Ambulatory Visit (INDEPENDENT_AMBULATORY_CARE_PROVIDER_SITE_OTHER): Payer: Medicare Other

## 2018-12-30 DIAGNOSIS — J309 Allergic rhinitis, unspecified: Secondary | ICD-10-CM | POA: Diagnosis not present

## 2019-01-06 ENCOUNTER — Ambulatory Visit (INDEPENDENT_AMBULATORY_CARE_PROVIDER_SITE_OTHER): Payer: Medicare Other

## 2019-01-06 DIAGNOSIS — J309 Allergic rhinitis, unspecified: Secondary | ICD-10-CM | POA: Diagnosis not present

## 2019-01-12 ENCOUNTER — Ambulatory Visit (INDEPENDENT_AMBULATORY_CARE_PROVIDER_SITE_OTHER): Payer: Medicare Other

## 2019-01-12 DIAGNOSIS — J309 Allergic rhinitis, unspecified: Secondary | ICD-10-CM | POA: Diagnosis not present

## 2019-01-19 ENCOUNTER — Ambulatory Visit (INDEPENDENT_AMBULATORY_CARE_PROVIDER_SITE_OTHER): Payer: Medicare Other

## 2019-01-19 DIAGNOSIS — J309 Allergic rhinitis, unspecified: Secondary | ICD-10-CM | POA: Diagnosis not present

## 2019-01-29 ENCOUNTER — Encounter: Payer: Self-pay | Admitting: Allergy and Immunology

## 2019-01-29 ENCOUNTER — Ambulatory Visit (INDEPENDENT_AMBULATORY_CARE_PROVIDER_SITE_OTHER): Payer: Medicare Other | Admitting: Allergy and Immunology

## 2019-01-29 VITALS — BP 140/84 | HR 100 | Temp 98.7°F | Resp 20 | Ht 62.5 in | Wt 210.2 lb

## 2019-01-29 DIAGNOSIS — J309 Allergic rhinitis, unspecified: Secondary | ICD-10-CM

## 2019-01-29 DIAGNOSIS — H1010 Acute atopic conjunctivitis, unspecified eye: Secondary | ICD-10-CM | POA: Diagnosis not present

## 2019-01-29 DIAGNOSIS — J4530 Mild persistent asthma, uncomplicated: Secondary | ICD-10-CM

## 2019-01-29 MED ORDER — FEXOFENADINE HCL 180 MG PO TABS: 180.0000 mg | ORAL_TABLET | Freq: Every day | ORAL | 3 refills | Status: DC | PRN

## 2019-01-29 NOTE — Patient Instructions (Addendum)
Mild persistent asthma Stable.  Continue Arnuity Ellipta as prescribed by Dr. Eulis Foster and follow with him as directed.  Continue albuterol HFA, 1 to 2 inhalations every 4-6 hours if needed and 15 minutes prior to exercise.  Allergic rhinoconjunctivitis  Continue appropriate allergen avoidance measures, aeroallergen immunotherapy as prescribed and as tolerated, and nasal saline irrigation of needed.  A prescription has been provided for fexofenadine (Allegra) 180 mg as needed and prior to immunotherapy injections.   Return in about 6 months (around 07/30/2019), or if symptoms worsen or fail to improve.

## 2019-01-29 NOTE — Assessment & Plan Note (Signed)
Stable.  Continue Arnuity Ellipta as prescribed by Dr. Eulis Foster and follow with him as directed.  Continue albuterol HFA, 1 to 2 inhalations every 4-6 hours if needed and 15 minutes prior to exercise.

## 2019-01-29 NOTE — Assessment & Plan Note (Addendum)
   Continue appropriate allergen avoidance measures, aeroallergen immunotherapy as prescribed and as tolerated, and nasal saline irrigation of needed.  A prescription has been provided for fexofenadine (Allegra) 180 mg as needed and prior to immunotherapy injections.

## 2019-01-29 NOTE — Progress Notes (Signed)
Follow-up Note  RE: Molly Frazier MRN: [id number] DOB: 1971-07-17 Date of Office Visit: 01/29/2019  Primary care provider: [facility] Referring provider: [facility]  History of present illness: Molly Frazier is a 48 y.o. female with asthma, allergic rhinoconjunctivitis on immunotherapy, and history of eosinophilic esophagitis presenting today for follow-up.  She is last seen in this clinic in September 2019.  She reports that she has been experiencing large local reactions from immunotherapy injections.  The nurses have recommended that she take an antihistamine, however she has not done so to this point.  She has no nasal allergy symptom complaints.  She is currently taking Arnuity Ellipta 100 g, 1 inhalation daily.  While on the Arnuity she requires albuterol rescue 1 time per week on average, typically with physical exertion.  Assessment and plan: Mild persistent asthma Stable.  Continue Arnuity Ellipta as prescribed by Dr. Eulis Foster and follow with him as directed.  Continue albuterol HFA, 1 to 2 inhalations every 4-6 hours if needed and 15 minutes prior to exercise.  Allergic rhinoconjunctivitis  Continue appropriate allergen avoidance measures, aeroallergen immunotherapy as prescribed and as tolerated, and nasal saline irrigation of needed.  A prescription has been provided for fexofenadine (Allegra) 180 mg as needed and prior to immunotherapy injections.   Meds ordered this encounter  Medications  . fexofenadine (ALLEGRA) 180 MG tablet    Sig: Take 1 tablet (180 mg total) by mouth daily as needed for allergies or rhinitis (take prior to allergy injections).    Dispense:  30 tablet    Refill:  3    Diagnostics: Spirometry:  Normal with an FEV1 of 110% predicted.  Please see scanned spirometry results for details.    Physical examination: Blood pressure 140/84, pulse 100, temperature 98.7 F (37.1 C), temperature source Oral, resp. rate 20,  height 5' 2.5" (1.588 m), weight 210 lb 3.2 oz (95.3 kg), SpO2 98 %.  General: Alert, interactive, in no acute distress. HEENT: TMs pearly gray, turbinates mildly edematous without discharge, post-pharynx moderately erythematous. Neck: Supple without lymphadenopathy. Lungs: Clear to auscultation without wheezing, rhonchi or rales. CV: Normal S1, S2 without murmurs. Skin: Warm and dry, without lesions or rashes.  The following portions of the patient's history were reviewed and updated as appropriate: allergies, current medications, past family history, past medical history, past social history, past surgical history and problem list.  Allergies as of 01/29/2019      Reactions   Wellbutrin [bupropion]    Other reaction(s): Other (See Comments) Pt has seizures   Benadryl [diphenhydramine Hcl] Rash   Oxycodone Nausea And Vomiting   Pregabalin Other (See Comments)   Seizure   Vicodin [hydrocodone-acetaminophen] Rash      Medication List       Accurate as of January 29, 2019 11:51 AM. Always use your most recent med list.        amitriptyline 50 MG tablet Commonly known as:  ELAVIL TAKE 1 TABLET BY MOUTH EVERYDAY AT BEDTIME   ARNUITY ELLIPTA 100 MCG/ACT Aepb Generic drug:  Fluticasone Furoate INHALE 1 PUFF BY MOUTH EVERY DAY   carbamazepine 200 MG 12 hr capsule Commonly known as:  CARBATROL Take 200 mg by mouth 2 (two) times daily. Reported on 11/28/2015   ciclopirox 8 % solution Commonly known as:  PENLAC   clonazePAM 0.5 MG tablet Commonly known as:  KLONOPIN Take 0.5 mg by mouth 2 (two) times daily. Reported on 11/28/2015   EPINEPHrine 0.3 mg/0.3 mL Soaj injection  Commonly known as:  EPIPEN 2-PAK USE AS DIRECTED FOR SEVERE ALLERGIC REACTIONS   fexofenadine 180 MG tablet Commonly known as:  ALLEGRA Take 1 tablet (180 mg total) by mouth daily as needed for allergies or rhinitis (take prior to allergy injections).   loxapine 50 MG capsule Commonly known as:   LOXITANE Take 50 mg by mouth 2 (two) times daily.   NON FORMULARY   omeprazole 40 MG capsule Commonly known as:  PRILOSEC Take 40 mg by mouth 2 (two) times daily. Reported on 11/28/2015   VENTOLIN HFA 108 (90 Base) MCG/ACT inhaler Generic drug:  albuterol Inhale 2 puffs into the lungs every 6 (six) hours as needed for wheezing or shortness of breath.       Allergies  Allergen Reactions  . Wellbutrin [Bupropion]     Other reaction(s): Other (See Comments) Pt has seizures  . Benadryl [Diphenhydramine Hcl] Rash  . Oxycodone Nausea And Vomiting  . Pregabalin Other (See Comments)    Seizure  . Vicodin [Hydrocodone-Acetaminophen] Rash    I appreciate the opportunity to take part in Molly Frazier's care. Please do not hesitate to contact me with questions.  Sincerely,   R. Jorene Guest, MD

## 2019-02-03 ENCOUNTER — Ambulatory Visit (INDEPENDENT_AMBULATORY_CARE_PROVIDER_SITE_OTHER): Payer: Medicare Other

## 2019-02-03 DIAGNOSIS — J309 Allergic rhinitis, unspecified: Secondary | ICD-10-CM | POA: Diagnosis not present

## 2019-02-17 ENCOUNTER — Ambulatory Visit (INDEPENDENT_AMBULATORY_CARE_PROVIDER_SITE_OTHER): Payer: Medicare Other

## 2019-02-17 DIAGNOSIS — J309 Allergic rhinitis, unspecified: Secondary | ICD-10-CM | POA: Diagnosis not present

## 2019-02-23 DIAGNOSIS — J301 Allergic rhinitis due to pollen: Secondary | ICD-10-CM | POA: Diagnosis not present

## 2019-02-23 NOTE — Progress Notes (Signed)
VIALS EXP 02-23-2020 

## 2019-02-24 DIAGNOSIS — J3081 Allergic rhinitis due to animal (cat) (dog) hair and dander: Secondary | ICD-10-CM | POA: Diagnosis not present

## 2019-03-02 ENCOUNTER — Ambulatory Visit (INDEPENDENT_AMBULATORY_CARE_PROVIDER_SITE_OTHER): Payer: Medicare Other

## 2019-03-02 DIAGNOSIS — J309 Allergic rhinitis, unspecified: Secondary | ICD-10-CM | POA: Diagnosis not present

## 2020-03-29 MED ORDER — GENERIC EXTERNAL MEDICATION
Status: DC
Start: ? — End: 2020-03-29

## 2020-03-29 MED ORDER — MIRTAZAPINE 15 MG PO TABS
15.00 | ORAL_TABLET | ORAL | Status: DC
Start: 2020-03-29 — End: 2020-03-29

## 2020-03-29 MED ORDER — AMINO ACIDS PO CHEW
CHEWABLE_TABLET | ORAL | Status: DC
Start: ? — End: 2020-03-29

## 2020-03-29 MED ORDER — CHLOROPHYLL EX
10.00 | CUTANEOUS | Status: DC
Start: ? — End: 2020-03-29

## 2020-03-29 MED ORDER — PANTOPRAZOLE SODIUM 40 MG PO TBEC
40.00 | DELAYED_RELEASE_TABLET | ORAL | Status: DC
Start: 2020-03-30 — End: 2020-03-29

## 2020-03-29 MED ORDER — ONDANSETRON 4 MG PO TBDP
4.00 | ORAL_TABLET | ORAL | Status: DC
Start: ? — End: 2020-03-29

## 2020-03-29 MED ORDER — ACETAMINOPHEN 325 MG PO TABS
650.00 | ORAL_TABLET | ORAL | Status: DC
Start: ? — End: 2020-03-29

## 2020-03-29 MED ORDER — NITROFURANTOIN MONOHYD MACRO 100 MG PO CAPS
100.00 | ORAL_CAPSULE | ORAL | Status: DC
Start: 2020-03-29 — End: 2020-03-29

## 2020-03-29 MED ORDER — CYANOCOBALAMIN 100 MCG PO TABS
100.00 | ORAL_TABLET | ORAL | Status: DC
Start: 2020-03-30 — End: 2020-03-29

## 2020-03-29 MED ORDER — LOXAPINE SUCCINATE 10 MG PO CAPS
20.00 | ORAL_CAPSULE | ORAL | Status: DC
Start: 2020-03-29 — End: 2020-03-29

## 2020-03-29 MED ORDER — ALBUTEROL SULFATE (2.5 MG/3ML) 0.083% IN NEBU
2.50 | INHALATION_SOLUTION | RESPIRATORY_TRACT | Status: DC
Start: ? — End: 2020-03-29

## 2020-03-29 MED ORDER — AMITRIPTYLINE HCL 25 MG PO TABS
25.00 | ORAL_TABLET | ORAL | Status: DC
Start: 2020-03-29 — End: 2020-03-29

## 2020-03-29 MED ORDER — ENOXAPARIN SODIUM 40 MG/0.4ML ~~LOC~~ SOLN
40.00 | SUBCUTANEOUS | Status: DC
Start: 2020-03-30 — End: 2020-03-29

## 2020-03-29 MED ORDER — BUSPIRONE HCL 5 MG PO TABS
5.00 | ORAL_TABLET | ORAL | Status: DC
Start: 2020-03-30 — End: 2020-03-29

## 2020-03-29 MED ORDER — POTASSIUM CHLORIDE CRYS ER 20 MEQ PO TBCR
40.00 | EXTENDED_RELEASE_TABLET | ORAL | Status: DC
Start: 2020-03-29 — End: 2020-03-29

## 2020-08-09 ENCOUNTER — Other Ambulatory Visit (HOSPITAL_COMMUNITY): Payer: Self-pay

## 2020-08-09 ENCOUNTER — Inpatient Hospital Stay
Admission: RE | Admit: 2020-08-09 | Discharge: 2020-08-24 | Disposition: A | Discharge status: Skilled Nursing Facility | Payer: Medicare Other | Source: Other Acute Inpatient Hospital | Attending: Internal Medicine | Admitting: Internal Medicine

## 2020-08-09 DIAGNOSIS — J9690 Respiratory failure, unspecified, unspecified whether with hypoxia or hypercapnia: Secondary | ICD-10-CM

## 2020-08-09 DIAGNOSIS — J9621 Acute and chronic respiratory failure with hypoxia: Secondary | ICD-10-CM | POA: Diagnosis present

## 2020-08-09 DIAGNOSIS — I2699 Other pulmonary embolism without acute cor pulmonale: Secondary | ICD-10-CM | POA: Diagnosis present

## 2020-08-09 DIAGNOSIS — J386 Stenosis of larynx: Secondary | ICD-10-CM | POA: Diagnosis present

## 2020-08-09 DIAGNOSIS — K5900 Constipation, unspecified: Secondary | ICD-10-CM

## 2020-08-09 DIAGNOSIS — I5022 Chronic systolic (congestive) heart failure: Secondary | ICD-10-CM | POA: Diagnosis present

## 2020-08-09 DIAGNOSIS — IMO0001 Reserved for inherently not codable concepts without codable children: Secondary | ICD-10-CM | POA: Diagnosis present

## 2020-08-09 HISTORY — DX: Acute and chronic respiratory failure with hypoxia: J96.21

## 2020-08-09 HISTORY — DX: Other pulmonary embolism without acute cor pulmonale: I26.99

## 2020-08-09 HISTORY — DX: Stenosis of larynx: J38.6

## 2020-08-09 HISTORY — DX: Chronic systolic (congestive) heart failure: I50.22

## 2020-08-09 HISTORY — DX: Reserved for inherently not codable concepts without codable children: IMO0001

## 2020-08-10 ENCOUNTER — Encounter: Payer: Self-pay | Admitting: Internal Medicine

## 2020-08-10 DIAGNOSIS — IMO0001 Reserved for inherently not codable concepts without codable children: Secondary | ICD-10-CM | POA: Diagnosis present

## 2020-08-10 DIAGNOSIS — I2699 Other pulmonary embolism without acute cor pulmonale: Secondary | ICD-10-CM | POA: Diagnosis present

## 2020-08-10 DIAGNOSIS — Z789 Other specified health status: Secondary | ICD-10-CM | POA: Diagnosis not present

## 2020-08-10 DIAGNOSIS — J9621 Acute and chronic respiratory failure with hypoxia: Secondary | ICD-10-CM | POA: Diagnosis not present

## 2020-08-10 DIAGNOSIS — J386 Stenosis of larynx: Secondary | ICD-10-CM | POA: Diagnosis present

## 2020-08-10 DIAGNOSIS — I5022 Chronic systolic (congestive) heart failure: Secondary | ICD-10-CM | POA: Diagnosis not present

## 2020-08-10 LAB — CBC
HCT: 29.2 % — ABNORMAL LOW (ref 36.0–46.0)
Hemoglobin: 8.8 g/dL — ABNORMAL LOW (ref 12.0–15.0)
MCH: 29.3 pg (ref 26.0–34.0)
MCHC: 30.1 g/dL (ref 30.0–36.0)
MCV: 97.3 fL (ref 80.0–100.0)
Platelets: 260 10*3/uL (ref 150–400)
RBC: 3 MIL/uL — ABNORMAL LOW (ref 3.87–5.11)
RDW: 18.2 % — ABNORMAL HIGH (ref 11.5–15.5)
WBC: 6.1 10*3/uL (ref 4.0–10.5)
nRBC: 0 % (ref 0.0–0.2)

## 2020-08-10 LAB — BASIC METABOLIC PANEL
Anion gap: 7 (ref 5–15)
BUN: <5 mg/dL — ABNORMAL LOW (ref 6–20)
CO2: 28 mmol/L (ref 22–32)
Calcium: 8.7 mg/dL — ABNORMAL LOW (ref 8.9–10.3)
Chloride: 103 mmol/L (ref 98–111)
Creatinine, Ser: 0.52 mg/dL (ref 0.44–1.00)
GFR calc Af Amer: >60 mL/min (ref >60)
GFR calc non Af Amer: >60 mL/min (ref >60)
Glucose, Bld: 96 mg/dL (ref 70–99)
Potassium: 3.7 mmol/L (ref 3.5–5.1)
Sodium: 138 mmol/L (ref 135–145)

## 2020-08-10 NOTE — Consult Note (Signed)
Pulmonary Critical Care Medicine [facility] [place]  PULMONARY SERVICE  Date of Service: 08/10/2020  PULMONARY CRITICAL CARE CONSULT   Leland Staszewski  OZD:[id number]  DOB: 1971/04/14   DOA: 08/09/2020  Referring Physician: Carron Curie, MD  HPI: Molly Frazier is a 49 y.o. female seen for follow up of Acute on Chronic Respiratory Failure.  Patient is with multiple medical problems including chronic asthma COPD subglottic stenosis aspiration pneumonia cardiac arrest polyneuropathy COVID-19 virus infection heart failure reduced ejection fraction last EF of 25% presented to the hospital with increasing shortness of breath.  Patient had a CT angiogram done which showed an acute pulmonary embolism with without right heart strain.  Patient decompensated with increasing respirations and ended up having to have a tracheostomy in an emergent situation because apparently patient had a previous history of subglottic stenosis so they opted to call ENT for acute tracheostomy which was done on August 28.  She is now transferred to our facility and is currently on T collar has been on 28% FiO2 appears to be doing well with it.  There is not quite clear whether this tracheostomy is supposed to be permanent.  Review of Systems:  ROS performed and is unremarkable other than noted above.  Past Medical History:  Diagnosis Date  . Asthma   . Family history of breast cancer   . Family history of colon cancer   . History of uterine cancer    Dx at age 64    Past Surgical History:  Procedure Laterality Date  . ESOPHAGEAL DILATION  09/09/2015    Social History:    reports that she has been smoking cigarettes. She has been smoking about 0.00 packs per day for the past 0.00 years. She has never used smokeless tobacco. She reports current drug use. Frequency: 7.00 times per week. Drugs: Marijuana and Other-see comments. She reports that she does not drink alcohol.  Family History: Non-Contributory to  the present illness  Allergies  Allergen Reactions  . Wellbutrin [Bupropion]     Other reaction(s): Other (See Comments) Pt has seizures  . Benadryl [Diphenhydramine Hcl] Rash  . Oxycodone Nausea And Vomiting  . Pregabalin Other (See Comments)    Seizure  . Vicodin [Hydrocodone-Acetaminophen] Rash    Medications: Reviewed on Rounds  Physical Exam:  Vitals: Temperature 97.9 pulse 114 respiratory 25 blood pressure is 130/87 saturations 100%  Ventilator Settings on T collar with an FiO2 of 28%  . General: Comfortable at this time . Eyes: Grossly normal lids, irises & conjunctiva . ENT: grossly tongue is normal . Neck: no obvious mass . Cardiovascular: S1-S2 normal no gallop or rub . Respiratory: Coarse breath sounds with few scattered rhonchi . Abdomen: Soft and nontender . Skin: no rash seen on limited exam . Musculoskeletal: not rigid . Psychiatric:unable to assess . Neurologic: no seizure no involuntary movements         Labs on Admission:  Basic Metabolic Panel: Recent Labs  Lab 08/10/20 1111  NA 138  K 3.7  CL 103  CO2 28  GLUCOSE 96  BUN <5*  CREATININE 0.52  CALCIUM 8.7*    No results for input(s): PHART, PCO2ART, PO2ART, HCO3, O2SAT in the last 168 hours.  Liver Function Tests: No results for input(s): AST, ALT, ALKPHOS, BILITOT, PROT, ALBUMIN in the last 168 hours. No results for input(s): LIPASE, AMYLASE in the last 168 hours. No results for input(s): AMMONIA in the last 168 hours.  CBC: Recent Labs  Lab 08/10/20 1111  WBC 6.1  HGB 8.8*  HCT 29.2*  MCV 97.3  PLT 260    Cardiac Enzymes: No results for input(s): CKTOTAL, CKMB, CKMBINDEX, TROPONINI in the last 168 hours.  BNP (last 3 results) No results for input(s): BNP in the last 8760 hours.  ProBNP (last 3 results) No results for input(s): PROBNP in the last 8760 hours.   Radiological Exams on Admission: DG Chest Port 1 View  Result Date: 08/09/2020 CLINICAL DATA:  Respiratory  failure, tracheostomy EXAM: PORTABLE CHEST 1 VIEW COMPARISON:  08/07/2020 FINDINGS: Single frontal view of the chest demonstrates stable tracheostomy tube. Right internal jugular catheter has been removed. The cardiac silhouette is unremarkable. No airspace disease, effusion, or pneumothorax. IMPRESSION: 1. Stable tracheostomy tube.  No acute process. Electronically Signed   By: Sharlet Salina M.D.   On: 08/09/2020 21:22    Assessment/Plan Active Problems:   Acute on chronic respiratory failure with hypoxia [facility])   Pulmonary embolism on right (HCC)   Subglottic stenosis   Chronic HFrEF (heart failure with reduced ejection fraction) (HCC)   Patient is Jehovah's Witness   1. Acute on chronic respiratory failure with hypoxia patient had to have emergent tracheostomy which was done at the other facility.  Now appears to be comfortable without any distress.  The question will be whether she is going to be a candidate for decannulation we will have to discuss with our ENT. 2. Acute right lower lobe pulmonary embolism patient was treated with anticoagulation plan will be to continue with supportive care. 3. Subglottic stenosis by history patient had another tracheostomy done in the past and this time they went straight for tracheostomy. 4. Chronic heart failure reduced ejection fraction plan is going to be to continue with the diuresis as tolerated we will monitor the fluid status.  Last ejection fraction was 25% 5. Jehovah's Witness we will need to monitor her hemoglobin closely most recent hemoglobin is 8.8.  I have personally seen and evaluated the patient, evaluated laboratory and imaging results, formulated the assessment and plan and placed orders. The Patient requires high complexity decision making with multiple systems involvement.  Case was discussed on Rounds with the Respiratory Therapy Director and the Respiratory staff Time Spent  Yevonne Pax, MD [facility] Pulmonary Critical  Care Medicine Sleep Medicine

## 2020-08-10 NOTE — Consult Note (Signed)
Referring Physician: Dr. Manson Passey, MD  Molly Frazier is an 49 y.o. female.                       Chief Complaint: F/U cardiac arrest  HPI: 49 years old black female with PMH of acute on chronic respiratory failure, asthma, COPD, subglottis stenosis, aspiration pneumonia, COVID-19 pneumonia, CHF, pulmonary embolism, s/p tracheostomy had V.fib cardiac arrest with life-vest use. Since her admission to hospital followed by acute long-term care here she has not used life-vest. She is awaiting ICD placement once she is stabilized. She also has sinus tachycardia. Her chest x-ray is unremarkable. Except for low Hgb her blood work is unremarkable.  Past Medical History:  Diagnosis Date  . Acute on chronic respiratory failure with hypoxia (HCC)   . Asthma   . Chronic HFrEF (heart failure with reduced ejection fraction) (HCC)   . Family history of breast cancer   . Family history of colon cancer   . History of uterine cancer    Dx at age 53  . Patient is Jehovah's Witness   . Pulmonary embolism on right (HCC)   . Subglottic stenosis       Past Surgical History:  Procedure Laterality Date  . ESOPHAGEAL DILATION  09/09/2015    Family History  Problem Relation Age of Onset  . Breast cancer Mother 28       currently in her 102s; TAH/BSO ~50  . Colon cancer Sister 82   Social History:  reports that she has been smoking cigarettes. She has been smoking about 0.00 packs per day for the past 0.00 years. She has never used smokeless tobacco. She reports current drug use. Frequency: 7.00 times per week. Drugs: Marijuana and Other-see comments. She reports that she does not drink alcohol.  Allergies:  Allergies  Allergen Reactions  . Wellbutrin [Bupropion]     Other reaction(s): Other (See Comments) Pt has seizures  . Benadryl [Diphenhydramine Hcl] Rash  . Oxycodone Nausea And Vomiting  . Pregabalin Other (See Comments)    Seizure  . Vicodin [Hydrocodone-Acetaminophen] Rash    Medications Prior  to Admission  Medication Sig Dispense Refill  . albuterol (VENTOLIN HFA) 108 (90 Base) MCG/ACT inhaler Inhale 2 puffs into the lungs every 6 (six) hours as needed for wheezing or shortness of breath.    Marland Kitchen amitriptyline (ELAVIL) 50 MG tablet TAKE 1 TABLET BY MOUTH EVERYDAY AT BEDTIME  2  . ARNUITY ELLIPTA 100 MCG/ACT AEPB INHALE 1 PUFF BY MOUTH EVERY DAY  2  . carbamazepine (CARBATROL) 200 MG 12 hr capsule Take 200 mg by mouth 2 (two) times daily. Reported on 11/28/2015    . ciclopirox (PENLAC) 8 % solution     . clonazePAM (KLONOPIN) 0.5 MG tablet Take 0.5 mg by mouth 2 (two) times daily. Reported on 11/28/2015    . EPINEPHrine (EPIPEN 2-PAK) 0.3 mg/0.3 mL IJ SOAJ injection USE AS DIRECTED FOR SEVERE ALLERGIC REACTIONS 2 Device 1  . fexofenadine (ALLEGRA) 180 MG tablet Take 1 tablet (180 mg total) by mouth daily as needed for allergies or rhinitis (take prior to allergy injections). 30 tablet 3  . loxapine (LOXITANE) 50 MG capsule Take 50 mg by mouth 2 (two) times daily.    . NON FORMULARY     . omeprazole (PRILOSEC) 40 MG capsule Take 40 mg by mouth 2 (two) times daily. Reported on 11/28/2015    Metoprolol 25 mg. One bid. Tapering dose prednisone Vitamin B-12, 500 mcg  and vitamin D 1000 units daily.  Results for orders placed or performed during the hospital encounter of 08/09/20 (from the past 48 hour(s))  Basic metabolic panel     Status: Abnormal   Collection Time: 08/10/20 11:11 AM  Result Value Ref Range   Sodium 138 135 - 145 mmol/L   Potassium 3.7 3.5 - 5.1 mmol/L   Chloride 103 98 - 111 mmol/L   CO2 28 22 - 32 mmol/L   Glucose, Bld 96 70 - 99 mg/dL    Comment: Glucose reference range applies only to samples taken after fasting for at least 8 hours.   BUN <5 (L) 6 - 20 mg/dL   Creatinine, Ser 1.76 0.44 - 1.00 mg/dL   Calcium 8.7 (L) 8.9 - 10.3 mg/dL   GFR calc non Af Amer >60 >60 mL/min   GFR calc Af Amer >60 >60 mL/min   Anion gap 7 5 - 15    Comment: Performed at [facility], [address], [place], [place] [address]  CBC     Status: Abnormal   Collection Time: 08/10/20 11:11 AM  Result Value Ref Range   WBC 6.1 4.0 - 10.5 K/uL   RBC 3.00 (L) 3.87 - 5.11 MIL/uL   Hemoglobin 8.8 (L) 12.0 - 15.0 g/dL   HCT 71.0 (L) 36 - 46 %   MCV 97.3 80.0 - 100.0 fL   MCH 29.3 26.0 - 34.0 pg   MCHC 30.1 30.0 - 36.0 g/dL   RDW 62.6 (H) 94.8 - 54.6 %   Platelets 260 150 - 400 K/uL   nRBC 0.0 0.0 - 0.2 %    Comment: Performed at [facility], [address], [place], [place] [address]   DG Chest Port 1 View  Result Date: 08/09/2020 CLINICAL DATA:  Respiratory failure, tracheostomy EXAM: PORTABLE CHEST 1 VIEW COMPARISON:  08/07/2020 FINDINGS: Single frontal view of the chest demonstrates stable tracheostomy tube. Right internal jugular catheter has been removed. The cardiac silhouette is unremarkable. No airspace disease, effusion, or pneumothorax. IMPRESSION: 1. Stable tracheostomy tube.  No acute process. Electronically Signed   By: Sharlet Salina M.D.   On: 08/09/2020 21:22    Review Of Systems Constitutional: Positive fever, chills, weight loss. Eyes: No vision change, wears glasses. No discharge or pain. Ears: No hearing loss, No tinnitus. Respiratory: Positive asthma, COPD, pneumonias, shortness of breath. No hemoptysis. Cardiovascular: No chest pain, positive palpitation, leg edema. Gastrointestinal: Positive nausea, vomiting, no diarrhea, constipation. No GI bleed. No hepatitis. Genitourinary: No dysuria, hematuria, kidney stone. No incontinance. Neurological: No headache, stroke, seizures.  Psychiatry: No psych facility admission for anxiety, depression, suicide. No detox. Skin: No rash. Musculoskeletal: Positive joint pain, fibromyalgia, neck pain, back pain. Lymphadenopathy: No lymphadenopathy. Hematology: Positive anemia or easy bruising.  T: 97.9 degree F, Pulse-120, R: 24, BP: 130/80 Saturation 100 %. There were no vitals taken for this  visit. There is no height or weight on file to calculate BMI. General appearance: alert, cooperative, appears stated age and no distress Head: Normocephalic, atraumatic. Eyes: [place] eyes, pink conjunctiva, corneas clear.  Neck: No adenopathy, no carotid bruit, no JVD, supple, symmetrical, trachea midline and thyroid not enlarged. Tracheostomy tube in place. Resp: Clear to auscultation bilaterally. Cardio: Tachycardic, Regular rate and rhythm, S1, S2 normal, II/VI systolic murmur, no click, rub or gallop GI: Soft, non-tender; bowel sounds normal; no organomegaly. Extremities: No edema, cyanosis or clubbing. Skin: Warm and dry.  Neurologic: Alert and oriented X  3.   Assessment/Plan Sinus tachycardia Anemia of blood loss/iron deficiency Acute on chronic respiratory failure with hypoxia S/P PE S/P COVID-19 infection COPD Asthma S/P tracheostomy H/O v.fib cardiac arrest H/O Life-vest use  Increase metoprolol dose. Postpone life-Vest use as long as remains on telemetry monitoring in acute care setting. Patient to see her ENT for subglottic stenosis and Cardiologist in [place] area for ICD placement when stable. Patient and daughter prefer postponing or avoiding Life-Vest use. Will use amiodarone only if Beta-blocker do not work or has recurrent VT.  Time spent: Review of old records, Lab, x-rays, EKG, other cardiac tests, examination, discussion with patient, nurse and referring doctor over 70 minutes.  Ricki Rodriguez, MD  08/10/2020, 5:30 PM

## 2020-08-11 DIAGNOSIS — J9621 Acute and chronic respiratory failure with hypoxia: Secondary | ICD-10-CM | POA: Diagnosis not present

## 2020-08-11 DIAGNOSIS — I5022 Chronic systolic (congestive) heart failure: Secondary | ICD-10-CM | POA: Diagnosis not present

## 2020-08-11 DIAGNOSIS — Z789 Other specified health status: Secondary | ICD-10-CM | POA: Diagnosis not present

## 2020-08-11 DIAGNOSIS — I2699 Other pulmonary embolism without acute cor pulmonale: Secondary | ICD-10-CM | POA: Diagnosis not present

## 2020-08-11 NOTE — Progress Notes (Addendum)
Pulmonary Critical Care Medicine [facility] [place]   PULMONARY CRITICAL CARE SERVICE  PROGRESS NOTE  Date of Service: 08/11/2020  Molly Frazier  IOE:[id number]  DOB: 1971/08/07   DOA: 08/09/2020  Referring Physician: Carron Curie, MD  HPI: Molly Frazier is a 49 y.o. female seen for follow up of Acute on Chronic Respiratory Failure.  Patient mains on 20% aerosol trach collar satting well no fever or distress  Medications: Reviewed on Rounds  Physical Exam:  Vitals: Pulse 91 respirations 22 BP 124/76 O2 sat 100% temp 97.8  Ventilator Settings 28% ATC  . General: Comfortable at this time . Eyes: Grossly normal lids, irises & conjunctiva . ENT: grossly tongue is normal . Neck: no obvious mass . Cardiovascular: S1 S2 normal no gallop . Respiratory: Coarse breath sounds . Abdomen: soft . Skin: no rash seen on limited exam . Musculoskeletal: not rigid . Psychiatric:unable to assess . Neurologic: no seizure no involuntary movements         Lab Data:   Basic Metabolic Panel: Recent Labs  Lab 08/10/20 1111  NA 138  K 3.7  CL 103  CO2 28  GLUCOSE 96  BUN <5*  CREATININE 0.52  CALCIUM 8.7*    ABG: No results for input(s): PHART, PCO2ART, PO2ART, HCO3, O2SAT in the last 168 hours.  Liver Function Tests: No results for input(s): AST, ALT, ALKPHOS, BILITOT, PROT, ALBUMIN in the last 168 hours. No results for input(s): LIPASE, AMYLASE in the last 168 hours. No results for input(s): AMMONIA in the last 168 hours.  CBC: Recent Labs  Lab 08/10/20 1111  WBC 6.1  HGB 8.8*  HCT 29.2*  MCV 97.3  PLT 260    Cardiac Enzymes: No results for input(s): CKTOTAL, CKMB, CKMBINDEX, TROPONINI in the last 168 hours.  BNP (last 3 results) No results for input(s): BNP in the last 8760 hours.  ProBNP (last 3 results) No results for input(s): PROBNP in the last 8760 hours.  Radiological Exams: No results found.  Assessment/Plan Active Problems:   Acute on  chronic respiratory failure with hypoxia (HCC)   Pulmonary embolism on right (HCC)   Subglottic stenosis   Chronic HFrEF (heart failure with reduced ejection fraction) (HCC)   Patient is Jehovah's Witness   1. Acute on chronic respiratory failure with hypoxia patient is doing very well on aerosol trach collar at this time 28% FiO2 we will continue with weaning at this time.  Continue supportive measures and pulmonary toilet 2. Acute right lower lobe pulmonary embolism patient was treated with anticoagulation plan will be to continue with supportive care. 3. Subglottic stenosis by history patient had another tracheostomy done in the past and this time they went straight for tracheostomy. 4. Chronic heart failure reduced ejection fraction plan is going to be to continue with the diuresis as tolerated we will monitor the fluid status.  Last ejection fraction was 25% 5. Jehovah's Witness we will need to monitor her hemoglobin closely most recent hemoglobin is 8.8.   I have personally seen and evaluated the patient, evaluated laboratory and imaging results, formulated the assessment and plan and placed orders. The Patient requires high complexity decision making with multiple systems involvement.  Rounds were done with the Respiratory Therapy Director and Staff therapists and discussed with nursing staff also.  Yevonne Pax, MD [facility] Pulmonary Critical Care Medicine Sleep Medicine

## 2020-08-12 DIAGNOSIS — Z789 Other specified health status: Secondary | ICD-10-CM | POA: Diagnosis not present

## 2020-08-12 DIAGNOSIS — I5022 Chronic systolic (congestive) heart failure: Secondary | ICD-10-CM | POA: Diagnosis not present

## 2020-08-12 DIAGNOSIS — I2699 Other pulmonary embolism without acute cor pulmonale: Secondary | ICD-10-CM | POA: Diagnosis not present

## 2020-08-12 DIAGNOSIS — J9621 Acute and chronic respiratory failure with hypoxia: Secondary | ICD-10-CM | POA: Diagnosis not present

## 2020-08-12 NOTE — Progress Notes (Addendum)
Pulmonary Critical Care Medicine [facility] [place]   PULMONARY CRITICAL CARE SERVICE  PROGRESS NOTE  Date of Service: 08/12/2020  Molly Frazier  [id number]  DOB: [birth year]   DOA: 08/09/2020  Referring Physician: Carron Curie, MD  HPI: Molly Frazier is a 49 y.o. female seen for follow up of Acute on Chronic Respiratory Failure.  Patient continues on 20% aerosol trach collar at this time satting well with no fever or distress  Medications: Reviewed on Rounds  Physical Exam:  Vitals: Pulse 99 respirations 36 BP 127/71 O2 sat 100% temp 96.2  Ventilator Settings ATC 28%  . General: Comfortable at this time . Eyes: Grossly normal lids, irises & conjunctiva . ENT: grossly tongue is normal . Neck: no obvious mass . Cardiovascular: S1 S2 normal no gallop . Respiratory: No rales or rhonchi noted . Abdomen: soft . Skin: no rash seen on limited exam . Musculoskeletal: not rigid . Psychiatric:unable to assess . Neurologic: no seizure no involuntary movements         Lab Data:   Basic Metabolic Panel: Recent Labs  Lab 08/10/20 1111  NA 138  K 3.7  CL 103  CO2 28  GLUCOSE 96  BUN <5*  CREATININE 0.52  CALCIUM 8.7*    ABG: No results for input(s): PHART, PCO2ART, PO2ART, HCO3, O2SAT in the last 168 hours.  Liver Function Tests: No results for input(s): AST, ALT, ALKPHOS, BILITOT, PROT, ALBUMIN in the last 168 hours. No results for input(s): LIPASE, AMYLASE in the last 168 hours. No results for input(s): AMMONIA in the last 168 hours.  CBC: Recent Labs  Lab 08/10/20 1111  WBC 6.1  HGB 8.8*  HCT 29.2*  MCV 97.3  PLT 260    Cardiac Enzymes: No results for input(s): CKTOTAL, CKMB, CKMBINDEX, TROPONINI in the last 168 hours.  BNP (last 3 results) No results for input(s): BNP in the last 8760 hours.  ProBNP (last 3 results) No results for input(s): PROBNP in the last 8760 hours.  Radiological Exams: No results found.  Assessment/Plan Active  Problems:   Acute on chronic respiratory failure with hypoxia (HCC)   Pulmonary embolism on right (HCC)   Subglottic stenosis   Chronic HFrEF (heart failure with reduced ejection fraction) (HCC)   Patient is Jehovah's Witness   1. Acute on chronic respiratory failure with hypoxia patient is doing very well on aerosol trach collar at this time 28% FiO2 we will continue with weaning at this time.  Continue supportive measures and pulmonary toilet 2. Acute right lower lobe pulmonary embolism patient was treated with anticoagulation plan will be to continue with supportive care. 3. Subglottic stenosis by history patient had another tracheostomy done in the past and this time they went straight for tracheostomy. 4. Chronic heart failure reduced ejection fraction plan is going to be to continue with the diuresis as tolerated we will monitor the fluid status. Last ejection fraction was 25% 5. Jehovah's Witness we will need to monitor her hemoglobin closely most recent hemoglobin is 8.8.   I have personally seen and evaluated the patient, evaluated laboratory and imaging results, formulated the assessment and plan and placed orders. The Patient requires high complexity decision making with multiple systems involvement.  Rounds were done with the Respiratory Therapy Director and Staff therapists and discussed with nursing staff also.  Yevonne Pax, MD [facility] Pulmonary Critical Care Medicine Sleep Medicine

## 2020-08-13 ENCOUNTER — Other Ambulatory Visit (HOSPITAL_COMMUNITY): Payer: Self-pay

## 2020-08-13 DIAGNOSIS — Z789 Other specified health status: Secondary | ICD-10-CM | POA: Diagnosis not present

## 2020-08-13 DIAGNOSIS — I2699 Other pulmonary embolism without acute cor pulmonale: Secondary | ICD-10-CM | POA: Diagnosis not present

## 2020-08-13 DIAGNOSIS — I5022 Chronic systolic (congestive) heart failure: Secondary | ICD-10-CM | POA: Diagnosis not present

## 2020-08-13 DIAGNOSIS — J9621 Acute and chronic respiratory failure with hypoxia: Secondary | ICD-10-CM | POA: Diagnosis not present

## 2020-08-13 NOTE — Progress Notes (Addendum)
Pulmonary Critical Care Medicine [facility] [place]   PULMONARY CRITICAL CARE SERVICE  PROGRESS NOTE  Date of Service: 08/13/2020  Zarina Pe  [id number]  DOB: Oct 19, 1971   DOA: 08/09/2020  Referring Physician: Carron Curie, MD  HPI: Katrenia Alkins is a 49 y.o. female seen for follow up of Acute on Chronic Respiratory Failure.  Patient right now is on T collar has been on 28% FiO2 with good saturations.  Medications: Reviewed on Rounds  Physical Exam:  Vitals: Temperature is 98.6 pulse 70 respiratory 20 blood pressure is 106/65 saturations 100%  Ventilator Settings on T collar with FiO2 28%  . General: Comfortable at this time . Eyes: Grossly normal lids, irises & conjunctiva . ENT: grossly tongue is normal . Neck: no obvious mass . Cardiovascular: S1 S2 normal no gallop . Respiratory: No rhonchi no rales are noted at this time . Abdomen: soft . Skin: no rash seen on limited exam . Musculoskeletal: not rigid . Psychiatric:unable to assess . Neurologic: no seizure no involuntary movements         Lab Data:   Basic Metabolic Panel: Recent Labs  Lab 08/10/20 1111  NA 138  K 3.7  CL 103  CO2 28  GLUCOSE 96  BUN <5*  CREATININE 0.52  CALCIUM 8.7*    ABG: No results for input(s): PHART, PCO2ART, PO2ART, HCO3, O2SAT in the last 168 hours.  Liver Function Tests: No results for input(s): AST, ALT, ALKPHOS, BILITOT, PROT, ALBUMIN in the last 168 hours. No results for input(s): LIPASE, AMYLASE in the last 168 hours. No results for input(s): AMMONIA in the last 168 hours.  CBC: Recent Labs  Lab 08/10/20 1111  WBC 6.1  HGB 8.8*  HCT 29.2*  MCV 97.3  PLT 260    Cardiac Enzymes: No results for input(s): CKTOTAL, CKMB, CKMBINDEX, TROPONINI in the last 168 hours.  BNP (last 3 results) No results for input(s): BNP in the last 8760 hours.  ProBNP (last 3 results) No results for input(s): PROBNP in the last 8760 hours.  Radiological  Exams: DG CHEST PORT 1 VIEW  Result Date: 08/13/2020 CLINICAL DATA:  Respiratory failure. EXAM: PORTABLE CHEST 1 VIEW COMPARISON:  08/09/2020 FINDINGS: Cardiac silhouette is normal in size. Tracheostomy tube is stable, tip projecting 1.5 cm above the carinal. No mediastinal or hilar masses. Clear lungs.  No pleural effusion or pneumothorax. Skeletal structures are grossly intact. IMPRESSION: No active disease. Electronically Signed   By: Amie Portland M.D.   On: 08/13/2020 13:12    Assessment/Plan Active Problems:   Acute on chronic respiratory failure with hypoxia (HCC)   Pulmonary embolism on right (HCC)   Subglottic stenosis   Chronic HFrEF (heart failure with reduced ejection fraction) (HCC)   Patient is Jehovah's Witness   1. Acute on chronic respiratory failure hypoxia on T collar FiO2 28% secretions are fairly copious. 2. Pulmonary embolism has been treated 3. Subglottic stenosis has tracheostomy in place 4. Chronic heart failure reduced ejection fraction prognosis guarded 5. Jehovah's Witness supportive care   I have personally seen and evaluated the patient, evaluated laboratory and imaging results, formulated the assessment and plan and placed orders. The Patient requires high complexity decision making with multiple systems involvement.  Rounds were done with the Respiratory Therapy Director and Staff therapists and discussed with nursing staff also.  Yevonne Pax, MD [facility] Pulmonary Critical Care Medicine Sleep Medicine

## 2020-08-14 ENCOUNTER — Other Ambulatory Visit (HOSPITAL_COMMUNITY): Payer: Self-pay

## 2020-08-14 DIAGNOSIS — I5022 Chronic systolic (congestive) heart failure: Secondary | ICD-10-CM | POA: Diagnosis not present

## 2020-08-14 DIAGNOSIS — I2699 Other pulmonary embolism without acute cor pulmonale: Secondary | ICD-10-CM | POA: Diagnosis not present

## 2020-08-14 DIAGNOSIS — J9621 Acute and chronic respiratory failure with hypoxia: Secondary | ICD-10-CM | POA: Diagnosis not present

## 2020-08-14 DIAGNOSIS — Z789 Other specified health status: Secondary | ICD-10-CM | POA: Diagnosis not present

## 2020-08-14 LAB — BASIC METABOLIC PANEL
Anion gap: 9 (ref 5–15)
BUN: 5 mg/dL — ABNORMAL LOW (ref 6–20)
CO2: 31 mmol/L (ref 22–32)
Calcium: 9.2 mg/dL (ref 8.9–10.3)
Chloride: 99 mmol/L (ref 98–111)
Creatinine, Ser: 0.66 mg/dL (ref 0.44–1.00)
GFR calc Af Amer: >60 mL/min (ref >60)
GFR calc non Af Amer: >60 mL/min (ref >60)
Glucose, Bld: 94 mg/dL (ref 70–99)
Potassium: 3.3 mmol/L — ABNORMAL LOW (ref 3.5–5.1)
Sodium: 139 mmol/L (ref 135–145)

## 2020-08-14 LAB — CBC
HCT: 31.5 % — ABNORMAL LOW (ref 36.0–46.0)
Hemoglobin: 9.2 g/dL — ABNORMAL LOW (ref 12.0–15.0)
MCH: 27.9 pg (ref 26.0–34.0)
MCHC: 29.2 g/dL — ABNORMAL LOW (ref 30.0–36.0)
MCV: 95.5 fL (ref 80.0–100.0)
Platelets: 366 10*3/uL (ref 150–400)
RBC: 3.3 MIL/uL — ABNORMAL LOW (ref 3.87–5.11)
RDW: 17.4 % — ABNORMAL HIGH (ref 11.5–15.5)
WBC: 14.6 10*3/uL — ABNORMAL HIGH (ref 4.0–10.5)
nRBC: 0 % (ref 0.0–0.2)

## 2020-08-14 NOTE — Progress Notes (Signed)
Pulmonary Critical Care Medicine [facility] [place]   PULMONARY CRITICAL CARE SERVICE  PROGRESS NOTE  Date of Service: 08/14/2020  Molly Frazier  YKD:[id number]  DOB: 03-Feb-1971   DOA: 08/09/2020  Referring Physician: Carron Curie, MD  HPI: Molly Frazier is a 49 y.o. female seen for follow up of Acute on Chronic Respiratory Failure.  Patient is on T collar currently 28% FiO2 secretions have been copious  Medications: Reviewed on Rounds  Physical Exam:  Vitals: Temperature is 98.6 pulse 70 respiratory 20 blood pressure is 106/65 saturations 100%  Ventilator Settings on T collar with an FiO2 28%  . General: Comfortable at this time . Eyes: Grossly normal lids, irises & conjunctiva . ENT: grossly tongue is normal . Neck: no obvious mass . Cardiovascular: S1 S2 normal no gallop . Respiratory: Very coarse breath sounds . Abdomen: soft . Skin: no rash seen on limited exam . Musculoskeletal: not rigid . Psychiatric:unable to assess . Neurologic: no seizure no involuntary movements         Lab Data:   Basic Metabolic Panel: Recent Labs  Lab 08/10/20 1111 08/14/20 0809  NA 138 139  K 3.7 3.3*  CL 103 99  CO2 28 31  GLUCOSE 96 94  BUN <5* 5*  CREATININE 0.52 0.66  CALCIUM 8.7* 9.2    ABG: No results for input(s): PHART, PCO2ART, PO2ART, HCO3, O2SAT in the last 168 hours.  Liver Function Tests: No results for input(s): AST, ALT, ALKPHOS, BILITOT, PROT, ALBUMIN in the last 168 hours. No results for input(s): LIPASE, AMYLASE in the last 168 hours. No results for input(s): AMMONIA in the last 168 hours.  CBC: Recent Labs  Lab 08/10/20 1111 08/14/20 0809  WBC 6.1 14.6*  HGB 8.8* 9.2*  HCT 29.2* 31.5*  MCV 97.3 95.5  PLT 260 366    Cardiac Enzymes: No results for input(s): CKTOTAL, CKMB, CKMBINDEX, TROPONINI in the last 168 hours.  BNP (last 3 results) No results for input(s): BNP in the last 8760 hours.  ProBNP (last 3 results) No results for  input(s): PROBNP in the last 8760 hours.  Radiological Exams: DG CHEST PORT 1 VIEW  Result Date: 08/13/2020 CLINICAL DATA:  Respiratory failure. EXAM: PORTABLE CHEST 1 VIEW COMPARISON:  08/09/2020 FINDINGS: Cardiac silhouette is normal in size. Tracheostomy tube is stable, tip projecting 1.5 cm above the carinal. No mediastinal or hilar masses. Clear lungs.  No pleural effusion or pneumothorax. Skeletal structures are grossly intact. IMPRESSION: No active disease. Electronically Signed   By: Amie Portland M.D.   On: 08/13/2020 13:12    Assessment/Plan Active Problems:   Acute on chronic respiratory failure with hypoxia (HCC)   Pulmonary embolism on right (HCC)   Subglottic stenosis   Chronic HFrEF (heart failure with reduced ejection fraction) (HCC)   Patient is Jehovah's Witness   1. Acute on chronic respiratory failure hypoxia we will continue with T collar titrate oxygen continue pulmonary toilet.  Secretions remain limiting issue right now 2. Pulmonary embolism has been treated we will continue with supportive care 3. Subglottic stenosis will need an ENT follow-up 4. Chronic heart failure reduced ejection fraction last EF was 25% cardiology following along 5. Jehovah's Witness   I have personally seen and evaluated the patient, evaluated laboratory and imaging results, formulated the assessment and plan and placed orders. The Patient requires high complexity decision making with multiple systems involvement.  Rounds were done with the Respiratory Therapy Director and Staff therapists and discussed with nursing staff  also.  Allyne Gee, MD [facility] Pulmonary Critical Care Medicine Sleep Medicine

## 2020-08-15 DIAGNOSIS — I2699 Other pulmonary embolism without acute cor pulmonale: Secondary | ICD-10-CM | POA: Diagnosis not present

## 2020-08-15 DIAGNOSIS — J9621 Acute and chronic respiratory failure with hypoxia: Secondary | ICD-10-CM | POA: Diagnosis not present

## 2020-08-15 DIAGNOSIS — Z789 Other specified health status: Secondary | ICD-10-CM | POA: Diagnosis not present

## 2020-08-15 DIAGNOSIS — I5022 Chronic systolic (congestive) heart failure: Secondary | ICD-10-CM | POA: Diagnosis not present

## 2020-08-15 NOTE — Progress Notes (Addendum)
Pulmonary Critical Care Medicine [facility] [place]   PULMONARY CRITICAL CARE SERVICE  PROGRESS NOTE  Date of Service: 08/15/2020  Kirstin Kugler  ULA:[id number]  DOB: 07-08-1971   DOA: 08/09/2020  Referring Physician: Carron Curie, MD  HPI: Sophonie Goforth is a 49 y.o. female seen for follow up of Acute on Chronic Respiratory Failure.  Patient is on T collar still with very copious secretions.  Right now is on 20% FiO2  Medications: Reviewed on Rounds  Physical Exam:  Vitals: Temperature is 96.9 pulse 104 respiratory 28 blood pressure is 120/68 saturations 95%  Ventilator Settings off the ventilator on T collar  . General: Comfortable at this time . Eyes: Grossly normal lids, irises & conjunctiva . ENT: grossly tongue is normal . Neck: no obvious mass . Cardiovascular: S1 S2 normal no gallop . Respiratory: No rhonchi no rales noted at this time . Abdomen: soft . Skin: no rash seen on limited exam . Musculoskeletal: not rigid . Psychiatric:unable to assess . Neurologic: no seizure no involuntary movements         Lab Data:   Basic Metabolic Panel: Recent Labs  Lab 08/10/20 1111 08/14/20 0809  NA 138 139  K 3.7 3.3*  CL 103 99  CO2 28 31  GLUCOSE 96 94  BUN <5* 5*  CREATININE 0.52 0.66  CALCIUM 8.7* 9.2    ABG: No results for input(s): PHART, PCO2ART, PO2ART, HCO3, O2SAT in the last 168 hours.  Liver Function Tests: No results for input(s): AST, ALT, ALKPHOS, BILITOT, PROT, ALBUMIN in the last 168 hours. No results for input(s): LIPASE, AMYLASE in the last 168 hours. No results for input(s): AMMONIA in the last 168 hours.  CBC: Recent Labs  Lab 08/10/20 1111 08/14/20 0809  WBC 6.1 14.6*  HGB 8.8* 9.2*  HCT 29.2* 31.5*  MCV 97.3 95.5  PLT 260 366    Cardiac Enzymes: No results for input(s): CKTOTAL, CKMB, CKMBINDEX, TROPONINI in the last 168 hours.  BNP (last 3 results) No results for input(s): BNP in the last 8760 hours.  ProBNP  (last 3 results) No results for input(s): PROBNP in the last 8760 hours.  Radiological Exams: DG Abd 1 View  Result Date: 08/14/2020 CLINICAL DATA:  Constipation EXAM: ABDOMEN - 1 VIEW COMPARISON:  CT abdomen 07/16/2020 FINDINGS: There is residual barium in the distal colon and rectum. The patient had a swallowing function exam including barium on 08/08/2020 and this persistent 6 days later favors constipation. There is also moderate prominence of stool in the colon, especially the proximal 2/3 of the colon. No dilated small bowel. The stomach is moderately prominent although this may be incidental. Vascular calcifications in the lower anatomic pelvis. IMPRESSION: 1. Moderate prominence of stool in the colon, especially the proximal 2/3 of the colon. There is also residual barium in the distal colon and rectum from swallowing function study 6 days ago. Appearance compatible with constipation. Electronically Signed   By: Gaylyn Rong M.D.   On: 08/14/2020 13:37   DG CHEST PORT 1 VIEW  Result Date: 08/13/2020 CLINICAL DATA:  Respiratory failure. EXAM: PORTABLE CHEST 1 VIEW COMPARISON:  08/09/2020 FINDINGS: Cardiac silhouette is normal in size. Tracheostomy tube is stable, tip projecting 1.5 cm above the carinal. No mediastinal or hilar masses. Clear lungs.  No pleural effusion or pneumothorax. Skeletal structures are grossly intact. IMPRESSION: No active disease. Electronically Signed   By: Amie Portland M.D.   On: 08/13/2020 13:12    Assessment/Plan Active Problems:  Acute on chronic respiratory failure with hypoxia (HCC)   Pulmonary embolism on right (HCC)   Subglottic stenosis   Chronic HFrEF (heart failure with reduced ejection fraction) (HCC)   Patient is Jehovah's Witness   1. Acute on chronic respiratory failure hypoxia we will continue with T collar trials titrate oxygen continue pulmonary toilet. 2. Pulmonary embolism treated anticoagulation 3. Subglottic stenosis will eventually  need ENT follow-up 4. Chronic heart failure reduced ejection fraction being followed by cardiology 5. Patient is Jehovah's Witness   I have personally seen and evaluated the patient, evaluated laboratory and imaging results, formulated the assessment and plan and placed orders. The Patient requires high complexity decision making with multiple systems involvement.  Rounds were done with the Respiratory Therapy Director and Staff therapists and discussed with nursing staff also.  Yevonne Pax, MD [facility] Pulmonary Critical Care Medicine Sleep Medicine

## 2020-08-16 DIAGNOSIS — Z789 Other specified health status: Secondary | ICD-10-CM | POA: Diagnosis not present

## 2020-08-16 DIAGNOSIS — J9621 Acute and chronic respiratory failure with hypoxia: Secondary | ICD-10-CM | POA: Diagnosis not present

## 2020-08-16 DIAGNOSIS — I5022 Chronic systolic (congestive) heart failure: Secondary | ICD-10-CM | POA: Diagnosis not present

## 2020-08-16 DIAGNOSIS — I2699 Other pulmonary embolism without acute cor pulmonale: Secondary | ICD-10-CM | POA: Diagnosis not present

## 2020-08-16 NOTE — Progress Notes (Addendum)
Pulmonary Critical Care Medicine [facility] [place]   PULMONARY CRITICAL CARE SERVICE  PROGRESS NOTE  Date of Service: 08/16/2020  Molly Frazier  NLG:[id number]  DOB: 12/04/1971   DOA: 08/09/2020  Referring Physician: Carron Curie, MD  HPI: Molly Frazier is a 49 y.o. female seen for follow up of Acute on Chronic Respiratory Failure. Patient remains on 28% T-Bar.  Sating well at this time. No distress noted.   Medications: Reviewed on Rounds  Physical Exam:  Vitals: pulse 82, resp 20, bp 98/59, o2 sat 100%, temp 96.9  Ventilator Settings 28% ATC  . General: Comfortable at this time . Eyes: Grossly normal lids, irises & conjunctiva . ENT: grossly tongue is normal . Neck: no obvious mass . Cardiovascular: S1 S2 normal no gallop . Respiratory: no rales or ronchi noted . Abdomen: soft . Skin: no rash seen on limited exam . Musculoskeletal: not rigid . Psychiatric:unable to assess . Neurologic: no seizure no involuntary movements         Lab Data:   Basic Metabolic Panel: Recent Labs  Lab 08/10/20 1111 08/14/20 0809  NA 138 139  K 3.7 3.3*  CL 103 99  CO2 28 31  GLUCOSE 96 94  BUN <5* 5*  CREATININE 0.52 0.66  CALCIUM 8.7* 9.2    ABG: No results for input(s): PHART, PCO2ART, PO2ART, HCO3, O2SAT in the last 168 hours.  Liver Function Tests: No results for input(s): AST, ALT, ALKPHOS, BILITOT, PROT, ALBUMIN in the last 168 hours. No results for input(s): LIPASE, AMYLASE in the last 168 hours. No results for input(s): AMMONIA in the last 168 hours.  CBC: Recent Labs  Lab 08/10/20 1111 08/14/20 0809  WBC 6.1 14.6*  HGB 8.8* 9.2*  HCT 29.2* 31.5*  MCV 97.3 95.5  PLT 260 366    Cardiac Enzymes: No results for input(s): CKTOTAL, CKMB, CKMBINDEX, TROPONINI in the last 168 hours.  BNP (last 3 results) No results for input(s): BNP in the last 8760 hours.  ProBNP (last 3 results) No results for input(s): PROBNP in the last 8760  hours.  Radiological Exams: No results found.  Assessment/Plan Active Problems:   Acute on chronic respiratory failure with hypoxia (HCC)   Pulmonary embolism on right (HCC)   Subglottic stenosis   Chronic HFrEF (heart failure with reduced ejection fraction) (HCC)   Patient is Jehovah's Witness   1. Acute on chronic respiratory failure hypoxia we will continue with T collar trials titrate oxygen continue pulmonary toilet. 2. Pulmonary embolism treated anticoagulation 3. Subglottic stenosis will eventually need ENT follow-up 4. Chronic heart failure reduced ejection fraction being followed by cardiology 5. Patient is Jehovah's Witness   I have personally seen and evaluated the patient, evaluated laboratory and imaging results, formulated the assessment and plan and placed orders. The Patient requires high complexity decision making with multiple systems involvement.  Rounds were done with the Respiratory Therapy Director and Staff therapists and discussed with nursing staff also.  Yevonne Pax, MD [facility] Pulmonary Critical Care Medicine Sleep Medicine

## 2020-08-17 DIAGNOSIS — I2699 Other pulmonary embolism without acute cor pulmonale: Secondary | ICD-10-CM | POA: Diagnosis not present

## 2020-08-17 DIAGNOSIS — J9621 Acute and chronic respiratory failure with hypoxia: Secondary | ICD-10-CM | POA: Diagnosis not present

## 2020-08-17 DIAGNOSIS — Z789 Other specified health status: Secondary | ICD-10-CM | POA: Diagnosis not present

## 2020-08-17 DIAGNOSIS — I5022 Chronic systolic (congestive) heart failure: Secondary | ICD-10-CM | POA: Diagnosis not present

## 2020-08-17 LAB — CBC
HCT: 29.4 % — ABNORMAL LOW (ref 36.0–46.0)
Hemoglobin: 8.7 g/dL — ABNORMAL LOW (ref 12.0–15.0)
MCH: 28.1 pg (ref 26.0–34.0)
MCHC: 29.6 g/dL — ABNORMAL LOW (ref 30.0–36.0)
MCV: 94.8 fL (ref 80.0–100.0)
Platelets: 400 10*3/uL (ref 150–400)
RBC: 3.1 MIL/uL — ABNORMAL LOW (ref 3.87–5.11)
RDW: 17.2 % — ABNORMAL HIGH (ref 11.5–15.5)
WBC: 14.1 10*3/uL — ABNORMAL HIGH (ref 4.0–10.5)
nRBC: 0 % (ref 0.0–0.2)

## 2020-08-17 NOTE — Progress Notes (Addendum)
Pulmonary Critical Care Medicine [facility] [place]   PULMONARY CRITICAL CARE SERVICE  PROGRESS NOTE  Date of Service: 08/17/2020  Molly Frazier  EPP:[id number]  DOB: 04-06-1971   DOA: 08/09/2020  Referring Physician: Carron Curie, MD  HPI: Molly Frazier is a 49 y.o. female seen for follow up of Acute on Chronic Respiratory Failure. Patient remains on ATC 28%, sating well at this time with no distress.   Medications: Reviewed on Rounds  Physical Exam:  Vitals: pulse 85, resp 27, bp 112/77, o2 sat 100%, temp 96.8  Ventilator Settings ATC 28%  . General: Comfortable at this time . Eyes: Grossly normal lids, irises & conjunctiva . ENT: grossly tongue is normal . Neck: no obvious mass . Cardiovascular: S1 S2 normal no gallop . Respiratory: no rales or ronchi noted . Abdomen: soft . Skin: no rash seen on limited exam . Musculoskeletal: not rigid . Psychiatric:unable to assess . Neurologic: no seizure no involuntary movements         Lab Data:   Basic Metabolic Panel: Recent Labs  Lab 08/14/20 0809  NA 139  K 3.3*  CL 99  CO2 31  GLUCOSE 94  BUN 5*  CREATININE 0.66  CALCIUM 9.2    ABG: No results for input(s): PHART, PCO2ART, PO2ART, HCO3, O2SAT in the last 168 hours.  Liver Function Tests: No results for input(s): AST, ALT, ALKPHOS, BILITOT, PROT, ALBUMIN in the last 168 hours. No results for input(s): LIPASE, AMYLASE in the last 168 hours. No results for input(s): AMMONIA in the last 168 hours.  CBC: Recent Labs  Lab 08/14/20 0809 08/17/20 0906  WBC 14.6* 14.1*  HGB 9.2* 8.7*  HCT 31.5* 29.4*  MCV 95.5 94.8  PLT 366 400    Cardiac Enzymes: No results for input(s): CKTOTAL, CKMB, CKMBINDEX, TROPONINI in the last 168 hours.  BNP (last 3 results) No results for input(s): BNP in the last 8760 hours.  ProBNP (last 3 results) No results for input(s): PROBNP in the last 8760 hours.  Radiological Exams: No results  found.  Assessment/Plan Active Problems:   Acute on chronic respiratory failure with hypoxia (HCC)   Pulmonary embolism on right (HCC)   Subglottic stenosis   Chronic HFrEF (heart failure with reduced ejection fraction) (HCC)   Patient is Jehovah's Witness   1. Acute on chronic respiratory failure hypoxia we will continue with T collar trials titrate oxygen continue pulmonary toilet. PT has consult pending with ENT, to assess for capping.  2. Pulmonary embolism treated anticoagulation 3. Subglottic stenosis will eventually need ENT follow-up 4. Chronic heart failure reduced ejection fraction being followed by cardiology 5. Patient is Jehovah's Witness   I have personally seen and evaluated the patient, evaluated laboratory and imaging results, formulated the assessment and plan and placed orders. The Patient requires high complexity decision making with multiple systems involvement.  Rounds were done with the Respiratory Therapy Director and Staff therapists and discussed with nursing staff also.  Yevonne Pax, MD [facility] Pulmonary Critical Care Medicine Sleep Medicine

## 2020-08-18 DIAGNOSIS — J9620 Acute and chronic respiratory failure, unspecified whether with hypoxia or hypercapnia: Secondary | ICD-10-CM

## 2020-08-18 DIAGNOSIS — I2699 Other pulmonary embolism without acute cor pulmonale: Secondary | ICD-10-CM | POA: Diagnosis not present

## 2020-08-18 DIAGNOSIS — I5022 Chronic systolic (congestive) heart failure: Secondary | ICD-10-CM | POA: Diagnosis not present

## 2020-08-18 DIAGNOSIS — J9621 Acute and chronic respiratory failure with hypoxia: Secondary | ICD-10-CM | POA: Diagnosis not present

## 2020-08-18 DIAGNOSIS — Z789 Other specified health status: Secondary | ICD-10-CM | POA: Diagnosis not present

## 2020-08-18 NOTE — Progress Notes (Signed)
Pulmonary Critical Care Medicine [facility] [place]   PULMONARY CRITICAL CARE SERVICE  PROGRESS NOTE  Date of Service: 08/18/2020  Molly Frazier  [id number]  DOB: 06-Sep-1971   DOA: 08/09/2020  Referring Physician: Carron Curie, MD  HPI: Molly Frazier is a 49 y.o. female seen for follow up of Acute on Chronic Respiratory Failure.  She is doing well ready for trach change on ENT is is supposed to be seeing the patient and communicating with the original ENT who did the tracheostomy.  We are holding off on decannulation at this time  Medications: Reviewed on Rounds  Physical Exam:  Vitals: Temperature 97.9 pulse 75 respiratory 21 blood pressure is 118/69 saturations 100%  Ventilator Settings on T collar with an FiO2 28%  . General: Comfortable at this time . Eyes: Grossly normal lids, irises & conjunctiva . ENT: grossly tongue is normal . Neck: no obvious mass . Cardiovascular: S1 S2 normal no gallop . Respiratory: No rhonchi very coarse breath sounds . Abdomen: soft . Skin: no rash seen on limited exam . Musculoskeletal: not rigid . Psychiatric:unable to assess . Neurologic: no seizure no involuntary movements         Lab Data:   Basic Metabolic Panel: Recent Labs  Lab 08/14/20 0809  NA 139  K 3.3*  CL 99  CO2 31  GLUCOSE 94  BUN 5*  CREATININE 0.66  CALCIUM 9.2    ABG: No results for input(s): PHART, PCO2ART, PO2ART, HCO3, O2SAT in the last 168 hours.  Liver Function Tests: No results for input(s): AST, ALT, ALKPHOS, BILITOT, PROT, ALBUMIN in the last 168 hours. No results for input(s): LIPASE, AMYLASE in the last 168 hours. No results for input(s): AMMONIA in the last 168 hours.  CBC: Recent Labs  Lab 08/14/20 0809 08/17/20 0906  WBC 14.6* 14.1*  HGB 9.2* 8.7*  HCT 31.5* 29.4*  MCV 95.5 94.8  PLT 366 400    Cardiac Enzymes: No results for input(s): CKTOTAL, CKMB, CKMBINDEX, TROPONINI in the last 168 hours.  BNP (last 3  results) No results for input(s): BNP in the last 8760 hours.  ProBNP (last 3 results) No results for input(s): PROBNP in the last 8760 hours.  Radiological Exams: No results found.  Assessment/Plan Active Problems:   Acute on chronic respiratory failure with hypoxia (HCC)   Pulmonary embolism on right (HCC)   Subglottic stenosis   Chronic HFrEF (heart failure with reduced ejection fraction) (HCC)   Patient is Jehovah's Witness   1. Acute on chronic respiratory failure hypoxia plan is to change the trach out to a cuffless trach today we will hold ENT input 2. Pulmonary embolus treated 3. Subglottic stenosis no change we will continue to follow 4. Chronic heart failure reduced ejection fraction at baseline 5. Patient is Jehovah's Witness   I have personally seen and evaluated the patient, evaluated laboratory and imaging results, formulated the assessment and plan and placed orders. The Patient requires high complexity decision making with multiple systems involvement.  Rounds were done with the Respiratory Therapy Director and Staff therapists and discussed with nursing staff also.  Yevonne Pax, MD [facility] Pulmonary Critical Care Medicine Sleep Medicine

## 2020-08-19 DIAGNOSIS — Z789 Other specified health status: Secondary | ICD-10-CM | POA: Diagnosis not present

## 2020-08-19 DIAGNOSIS — J9621 Acute and chronic respiratory failure with hypoxia: Secondary | ICD-10-CM | POA: Diagnosis not present

## 2020-08-19 DIAGNOSIS — I5022 Chronic systolic (congestive) heart failure: Secondary | ICD-10-CM | POA: Diagnosis not present

## 2020-08-19 DIAGNOSIS — I2699 Other pulmonary embolism without acute cor pulmonale: Secondary | ICD-10-CM | POA: Diagnosis not present

## 2020-08-19 NOTE — Progress Notes (Addendum)
Pulmonary Critical Care Medicine [facility] [place]   PULMONARY CRITICAL CARE SERVICE  PROGRESS NOTE  Date of Service: 08/19/2020  Molly Frazier  WVP:[id number]  DOB: 1971/03/21   DOA: 08/09/2020  Referring Physician: Carron Curie, MD  HPI: Molly Frazier is a 49 y.o. female seen for follow up of Acute on Chronic Respiratory Failure.  Patient mains on 20% aerosol trach collar satting well no fever or distress.  Medications: Reviewed on Rounds  Physical Exam:  Vitals: Pulse 85 respirations 16 BP 103/68 O2 sat 100% 7.5  Ventilator Settings 28% ATC  . General: Comfortable at this time . Eyes: Grossly normal lids, irises & conjunctiva . ENT: grossly tongue is normal . Neck: no obvious mass . Cardiovascular: S1 S2 normal no gallop . Respiratory: No rales or rhonchi noted . Abdomen: soft . Skin: no rash seen on limited exam . Musculoskeletal: not rigid . Psychiatric:unable to assess . Neurologic: no seizure no involuntary movements         Lab Data:   Basic Metabolic Panel: Recent Labs  Lab 08/14/20 0809  NA 139  K 3.3*  CL 99  CO2 31  GLUCOSE 94  BUN 5*  CREATININE 0.66  CALCIUM 9.2    ABG: No results for input(s): PHART, PCO2ART, PO2ART, HCO3, O2SAT in the last 168 hours.  Liver Function Tests: No results for input(s): AST, ALT, ALKPHOS, BILITOT, PROT, ALBUMIN in the last 168 hours. No results for input(s): LIPASE, AMYLASE in the last 168 hours. No results for input(s): AMMONIA in the last 168 hours.  CBC: Recent Labs  Lab 08/14/20 0809 08/17/20 0906  WBC 14.6* 14.1*  HGB 9.2* 8.7*  HCT 31.5* 29.4*  MCV 95.5 94.8  PLT 366 400    Cardiac Enzymes: No results for input(s): CKTOTAL, CKMB, CKMBINDEX, TROPONINI in the last 168 hours.  BNP (last 3 results) No results for input(s): BNP in the last 8760 hours.  ProBNP (last 3 results) No results for input(s): PROBNP in the last 8760 hours.  Radiological Exams: No results  found.  Assessment/Plan Active Problems:   Acute on chronic respiratory failure with hypoxia (HCC)   Pulmonary embolism on right (HCC)   Subglottic stenosis   Chronic HFrEF (heart failure with reduced ejection fraction) (HCC)   Patient is Jehovah's Witness   1. Acute on chronic respiratory failure hypoxia patient continue on 20% aerosol trach collar we will continue aggressive pulmonary toilet supportive measures. 2. Pulmonary embolus treated 3. Subglottic stenosis no change we will continue to follow 4. Chronic heart failure reduced ejection fraction at baseline 5. Patient is Jehovah's Witness   I have personally seen and evaluated the patient, evaluated laboratory and imaging results, formulated the assessment and plan and placed orders. The Patient requires high complexity decision making with multiple systems involvement.  Rounds were done with the Respiratory Therapy Director and Staff therapists and discussed with nursing staff also.  Yevonne Pax, MD [facility] Pulmonary Critical Care Medicine Sleep Medicine

## 2020-08-20 DIAGNOSIS — I2699 Other pulmonary embolism without acute cor pulmonale: Secondary | ICD-10-CM | POA: Diagnosis not present

## 2020-08-20 DIAGNOSIS — I5022 Chronic systolic (congestive) heart failure: Secondary | ICD-10-CM | POA: Diagnosis not present

## 2020-08-20 DIAGNOSIS — J9621 Acute and chronic respiratory failure with hypoxia: Secondary | ICD-10-CM | POA: Diagnosis not present

## 2020-08-20 DIAGNOSIS — Z789 Other specified health status: Secondary | ICD-10-CM | POA: Diagnosis not present

## 2020-08-20 LAB — CBC
HCT: 32.3 % — ABNORMAL LOW (ref 36.0–46.0)
Hemoglobin: 9.4 g/dL — ABNORMAL LOW (ref 12.0–15.0)
MCH: 27.9 pg (ref 26.0–34.0)
MCHC: 29.1 g/dL — ABNORMAL LOW (ref 30.0–36.0)
MCV: 95.8 fL (ref 80.0–100.0)
Platelets: 418 10*3/uL — ABNORMAL HIGH (ref 150–400)
RBC: 3.37 MIL/uL — ABNORMAL LOW (ref 3.87–5.11)
RDW: 17.2 % — ABNORMAL HIGH (ref 11.5–15.5)
WBC: 12.7 10*3/uL — ABNORMAL HIGH (ref 4.0–10.5)
nRBC: 0 % (ref 0.0–0.2)

## 2020-08-20 LAB — BASIC METABOLIC PANEL
Anion gap: 9 (ref 5–15)
BUN: 9 mg/dL (ref 6–20)
CO2: 30 mmol/L (ref 22–32)
Calcium: 8.7 mg/dL — ABNORMAL LOW (ref 8.9–10.3)
Chloride: 101 mmol/L (ref 98–111)
Creatinine, Ser: 0.71 mg/dL (ref 0.44–1.00)
GFR calc Af Amer: >60 mL/min (ref >60)
GFR calc non Af Amer: >60 mL/min (ref >60)
Glucose, Bld: 87 mg/dL (ref 70–99)
Potassium: 3.6 mmol/L (ref 3.5–5.1)
Sodium: 140 mmol/L (ref 135–145)

## 2020-08-20 NOTE — Progress Notes (Signed)
Pulmonary Critical Care Medicine [facility] [place]   PULMONARY CRITICAL CARE SERVICE  PROGRESS NOTE  Date of Service: 08/20/2020  Molly Frazier  [id number]  DOB: [birth year]   DOA: 08/09/2020  Referring Physician: Carron Curie, MD  HPI: Molly Frazier is a 49 y.o. female seen for follow up of Acute on Chronic Respiratory Failure.  I spoke to the ENT over the phone and discussed the patient's case apparently they would like for her to be seen by a laryngologist in [place] prior to considering decannulation in case she needs some type of intervention by that provider.  The patient is okay to have the tracheostomy changed it will not be decannulated and has been cleared by the surgeons.  Medications: Reviewed on Rounds  Physical Exam:  Vitals: Temperature is 97.5 pulse 86 respiratory rate 17 blood pressure is 93/56 saturations 100%  Ventilator Settings off the ventilator on T collar FiO2 of 28%   General: Comfortable at this time  Eyes: Grossly normal lids, irises & conjunctiva  ENT: grossly tongue is normal  Neck: no obvious mass  Cardiovascular: S1 S2 normal no gallop  Respiratory: No rhonchi very coarse breath sounds are noted  Abdomen: soft  Skin: no rash seen on limited exam  Musculoskeletal: not rigid  Psychiatric:unable to assess  Neurologic: no seizure no involuntary movements         Lab Data:   Basic Metabolic Panel: Recent Labs  Lab 08/14/20 0809 08/20/20 0741  NA 139 140  K 3.3* 3.6  CL 99 101  CO2 31 30  GLUCOSE 94 87  BUN 5* 9  CREATININE 0.66 0.71  CALCIUM 9.2 8.7*    ABG: No results for input(s): PHART, PCO2ART, PO2ART, HCO3, O2SAT in the last 168 hours.  Liver Function Tests: No results for input(s): AST, ALT, ALKPHOS, BILITOT, PROT, ALBUMIN in the last 168 hours. No results for input(s): LIPASE, AMYLASE in the last 168 hours. No results for input(s): AMMONIA in the last 168 hours.  CBC: Recent Labs  Lab  08/14/20 0809 08/17/20 0906 08/20/20 0741  WBC 14.6* 14.1* 12.7*  HGB 9.2* 8.7* 9.4*  HCT 31.5* 29.4* 32.3*  MCV 95.5 94.8 95.8  PLT 366 400 418*    Cardiac Enzymes: No results for input(s): CKTOTAL, CKMB, CKMBINDEX, TROPONINI in the last 168 hours.  BNP (last 3 results) No results for input(s): BNP in the last 8760 hours.  ProBNP (last 3 results) No results for input(s): PROBNP in the last 8760 hours.  Radiological Exams: No results found.  Assessment/Plan Active Problems:   Acute on chronic respiratory failure with hypoxia (HCC)   Pulmonary embolism on right (HCC)   Subglottic stenosis   Chronic HFrEF (heart failure with reduced ejection fraction) (HCC)   Patient is Jehovah's Witness   1. Acute on chronic respiratory failure with hypoxia plan is to continue with T collar trials.  In addition we will go ahead and do a tracheostomy change for her to a cuffless #6 trach 2. pulmonary embolism has been treated we will continue to monitor closely. 3. Chronic heart failure reduced ejection fraction continue with supportive care. 4. Subglottic stenosis will need follow-up with ENT as discussed above the name of the laryngologist has been given to staff so that we can try to make arrangements for a follow-up appointment 5. patient is Jehovah's Witness   I have personally seen and evaluated the patient, evaluated laboratory and imaging results, formulated the assessment and plan and placed orders. The Patient requires  high complexity decision making with multiple systems involvement.  Rounds were done with the Respiratory Therapy Director and Staff therapists and discussed with nursing staff also.  Allyne Gee, MD [facility] Pulmonary Critical Care Medicine Sleep Medicine

## 2020-08-21 DIAGNOSIS — J9621 Acute and chronic respiratory failure with hypoxia: Secondary | ICD-10-CM | POA: Diagnosis not present

## 2020-08-21 DIAGNOSIS — Z789 Other specified health status: Secondary | ICD-10-CM | POA: Diagnosis not present

## 2020-08-21 DIAGNOSIS — I5022 Chronic systolic (congestive) heart failure: Secondary | ICD-10-CM | POA: Diagnosis not present

## 2020-08-21 DIAGNOSIS — I2699 Other pulmonary embolism without acute cor pulmonale: Secondary | ICD-10-CM | POA: Diagnosis not present

## 2020-08-21 NOTE — Progress Notes (Signed)
Pulmonary Critical Care Medicine [facility] [place]   PULMONARY CRITICAL CARE SERVICE  PROGRESS NOTE  Date of Service: 08/21/2020  Molly Frazier  HUT:[id number]  DOB: December 10, 1971   DOA: 08/09/2020  Referring Physician: Carron Curie, MD  HPI: Molly Frazier is a 49 y.o. female seen for follow up of Acute on Chronic Respiratory Failure.  Patient currently is doing well with the PMV tracheostomy was changed out yesterday to a cuffless #6 trach after discussion with ENT  Medications: Reviewed on Rounds  Physical Exam:  Vitals: Temperature 97.1 pulse 82 respiratory 24 blood pressure is one 4/76 saturations 100%  Ventilator Settings off the ventilator on tracheostomy #6 cuffless trach  . General: Comfortable at this time . Eyes: Grossly normal lids, irises & conjunctiva . ENT: grossly tongue is normal . Neck: no obvious mass . Cardiovascular: S1 S2 normal no gallop . Respiratory: No rhonchi . Abdomen: soft . Skin: no rash seen on limited exam . Musculoskeletal: not rigid . Psychiatric:unable to assess . Neurologic: no seizure no involuntary movements         Lab Data:   Basic Metabolic Panel: Recent Labs  Lab 08/20/20 0741  NA 140  K 3.6  CL 101  CO2 30  GLUCOSE 87  BUN 9  CREATININE 0.71  CALCIUM 8.7*    ABG: No results for input(s): PHART, PCO2ART, PO2ART, HCO3, O2SAT in the last 168 hours.  Liver Function Tests: No results for input(s): AST, ALT, ALKPHOS, BILITOT, PROT, ALBUMIN in the last 168 hours. No results for input(s): LIPASE, AMYLASE in the last 168 hours. No results for input(s): AMMONIA in the last 168 hours.  CBC: Recent Labs  Lab 08/17/20 0906 08/20/20 0741  WBC 14.1* 12.7*  HGB 8.7* 9.4*  HCT 29.4* 32.3*  MCV 94.8 95.8  PLT 400 418*    Cardiac Enzymes: No results for input(s): CKTOTAL, CKMB, CKMBINDEX, TROPONINI in the last 168 hours.  BNP (last 3 results) No results for input(s): BNP in the last 8760 hours.  ProBNP (last  3 results) No results for input(s): PROBNP in the last 8760 hours.  Radiological Exams: No results found.  Assessment/Plan Active Problems:   Acute on chronic respiratory failure with hypoxia (HCC)   Pulmonary embolism on right (HCC)   Subglottic stenosis   Chronic HFrEF (heart failure with reduced ejection fraction) (HCC)   Patient is Jehovah's Witness   1. Acute on chronic respiratory failure with hypoxia we will continue with the T collar as ordered tolerating the PMV well 2. Pulmonary embolism treated continue with supportive care 3. Subglottic stenosis will need follow-up with ENT and laryngology 4. Chronic heart failure reduced ejection fraction at some point will need a follow-up echocardiogram to reassess 5. Jehovah's Witness   I have personally seen and evaluated the patient, evaluated laboratory and imaging results, formulated the assessment and plan and placed orders. The Patient requires high complexity decision making with multiple systems involvement.  Rounds were done with the Respiratory Therapy Director and Staff therapists and discussed with nursing staff also.  Yevonne Pax, MD [facility] Pulmonary Critical Care Medicine Sleep Medicine

## 2020-08-22 DIAGNOSIS — J9621 Acute and chronic respiratory failure with hypoxia: Secondary | ICD-10-CM | POA: Diagnosis not present

## 2020-08-22 DIAGNOSIS — I2699 Other pulmonary embolism without acute cor pulmonale: Secondary | ICD-10-CM | POA: Diagnosis not present

## 2020-08-22 DIAGNOSIS — Z789 Other specified health status: Secondary | ICD-10-CM | POA: Diagnosis not present

## 2020-08-22 DIAGNOSIS — I5022 Chronic systolic (congestive) heart failure: Secondary | ICD-10-CM | POA: Diagnosis not present

## 2020-08-22 NOTE — Progress Notes (Signed)
Pulmonary Critical Care Medicine [facility] [place]   PULMONARY CRITICAL CARE SERVICE  PROGRESS NOTE  Date of Service: 08/22/2020  Zunaira Lamy  FOY:[id number]  DOB: 1971-11-26   DOA: 08/09/2020  Referring Physician: Carron Curie, MD  HPI: Mattea Seger is a 49 y.o. female seen for follow up of Acute on Chronic Respiratory Failure.  Patient currently is on T collar has been on 20% FiO2 doing well she is at baseline until cleared by laryngology  Medications: Reviewed on Rounds  Physical Exam:  Vitals: Temperature 96.1 pulse 71 respiratory rate 21 blood pressure is 102/73 saturations 100%  Ventilator Settings on T collar FiO2 28% with PMV  . General: Comfortable at this time . Eyes: Grossly normal lids, irises & conjunctiva . ENT: grossly tongue is normal . Neck: no obvious mass . Cardiovascular: S1 S2 normal no gallop . Respiratory: No rhonchi coarse breath sounds . Abdomen: soft . Skin: no rash seen on limited exam . Musculoskeletal: not rigid . Psychiatric:unable to assess . Neurologic: no seizure no involuntary movements         Lab Data:   Basic Metabolic Panel: Recent Labs  Lab 08/20/20 0741  NA 140  K 3.6  CL 101  CO2 30  GLUCOSE 87  BUN 9  CREATININE 0.71  CALCIUM 8.7*    ABG: No results for input(s): PHART, PCO2ART, PO2ART, HCO3, O2SAT in the last 168 hours.  Liver Function Tests: No results for input(s): AST, ALT, ALKPHOS, BILITOT, PROT, ALBUMIN in the last 168 hours. No results for input(s): LIPASE, AMYLASE in the last 168 hours. No results for input(s): AMMONIA in the last 168 hours.  CBC: Recent Labs  Lab 08/17/20 0906 08/20/20 0741  WBC 14.1* 12.7*  HGB 8.7* 9.4*  HCT 29.4* 32.3*  MCV 94.8 95.8  PLT 400 418*    Cardiac Enzymes: No results for input(s): CKTOTAL, CKMB, CKMBINDEX, TROPONINI in the last 168 hours.  BNP (last 3 results) No results for input(s): BNP in the last 8760 hours.  ProBNP (last 3 results) No  results for input(s): PROBNP in the last 8760 hours.  Radiological Exams: No results found.  Assessment/Plan Active Problems:   Acute on chronic respiratory failure with hypoxia (HCC)   Pulmonary embolism on right (HCC)   Subglottic stenosis   Chronic HFrEF (heart failure with reduced ejection fraction) (HCC)   Patient is Jehovah's Witness   1. Acute on chronic respiratory failure with hypoxia patient continues to do fine on T collar with the PMV she is doing well with the trach change also. 2. Pulmonary embolism treated clinically improved 3. Subglottic stenosis follow-up with ENT 4. Chronic heart failure reduced ejection fraction supportive care 5. Patient is Jehovah's Witness   I have personally seen and evaluated the patient, evaluated laboratory and imaging results, formulated the assessment and plan and placed orders. The Patient requires high complexity decision making with multiple systems involvement.  Rounds were done with the Respiratory Therapy Director and Staff therapists and discussed with nursing staff also.  Yevonne Pax, MD [facility] Pulmonary Critical Care Medicine Sleep Medicine

## 2020-08-23 DIAGNOSIS — Z789 Other specified health status: Secondary | ICD-10-CM | POA: Diagnosis not present

## 2020-08-23 DIAGNOSIS — I5022 Chronic systolic (congestive) heart failure: Secondary | ICD-10-CM | POA: Diagnosis not present

## 2020-08-23 DIAGNOSIS — I2699 Other pulmonary embolism without acute cor pulmonale: Secondary | ICD-10-CM | POA: Diagnosis not present

## 2020-08-23 DIAGNOSIS — J9621 Acute and chronic respiratory failure with hypoxia: Secondary | ICD-10-CM | POA: Diagnosis not present

## 2020-08-23 NOTE — Progress Notes (Addendum)
Pulmonary Critical Care Medicine [facility] [place]   PULMONARY CRITICAL CARE SERVICE  PROGRESS NOTE  Date of Service: 08/23/2020  Molly Frazier  SHF:[id number]  DOB: [birth year]   DOA: 08/09/2020  Referring Physician: Carron Curie, MD  HPI: Molly Frazier is a 49 y.o. female seen for follow up of Acute on Chronic Respiratory Failure. Patient remains on ATC 28% with PMV in place.  Sating well with no distress.   Medications: Reviewed on Rounds  Physical Exam:  Vitals: pulse 82, resp 20, bp 95/57, o2 sat 99%, temp 98.3  Ventilator Settings 28% ATC  . General: Comfortable at this time . Eyes: Grossly normal lids, irises & conjunctiva . ENT: grossly tongue is normal . Neck: no obvious mass . Cardiovascular: S1 S2 normal no gallop . Respiratory: No rales or ronchi noted . Abdomen: soft . Skin: no rash seen on limited exam . Musculoskeletal: not rigid . Psychiatric:unable to assess . Neurologic: no seizure no involuntary movements         Lab Data:   Basic Metabolic Panel: Recent Labs  Lab 08/20/20 0741  NA 140  K 3.6  CL 101  CO2 30  GLUCOSE 87  BUN 9  CREATININE 0.71  CALCIUM 8.7*    ABG: No results for input(s): PHART, PCO2ART, PO2ART, HCO3, O2SAT in the last 168 hours.  Liver Function Tests: No results for input(s): AST, ALT, ALKPHOS, BILITOT, PROT, ALBUMIN in the last 168 hours. No results for input(s): LIPASE, AMYLASE in the last 168 hours. No results for input(s): AMMONIA in the last 168 hours.  CBC: Recent Labs  Lab 08/17/20 0906 08/20/20 0741  WBC 14.1* 12.7*  HGB 8.7* 9.4*  HCT 29.4* 32.3*  MCV 94.8 95.8  PLT 400 418*    Cardiac Enzymes: No results for input(s): CKTOTAL, CKMB, CKMBINDEX, TROPONINI in the last 168 hours.  BNP (last 3 results) No results for input(s): BNP in the last 8760 hours.  ProBNP (last 3 results) No results for input(s): PROBNP in the last 8760 hours.  Radiological Exams: No results  found.  Assessment/Plan Active Problems:   Acute on chronic respiratory failure with hypoxia (HCC)   Pulmonary embolism on right (HCC)   Subglottic stenosis   Chronic HFrEF (heart failure with reduced ejection fraction) (HCC)   Patient is Jehovah's Witness   1. Acute on chronic respiratory failure with hypoxia patient continues to do fine on T collar with the PMV continue supportive measures and pulmonary toilet.  2. Pulmonary embolism treated clinically improved 3. Subglottic stenosis follow-up with ENT 4. Chronic heart failure reduced ejection fraction supportive care 5. Patient is Jehovah's Witness   I have personally seen and evaluated the patient, evaluated laboratory and imaging results, formulated the assessment and plan and placed orders. The Patient requires high complexity decision making with multiple systems involvement.  Rounds were done with the Respiratory Therapy Director and Staff therapists and discussed with nursing staff also.  Yevonne Pax, MD [facility] Pulmonary Critical Care Medicine Sleep Medicine

## 2021-04-10 IMAGING — DX DG CHEST 1V PORT
1 series · 1 of 1 positions shown · non-contrast
Comparison: 08/09/2020

CLINICAL DATA: Respiratory failure.

EXAM:
PORTABLE CHEST 1 VIEW

[chest ap]
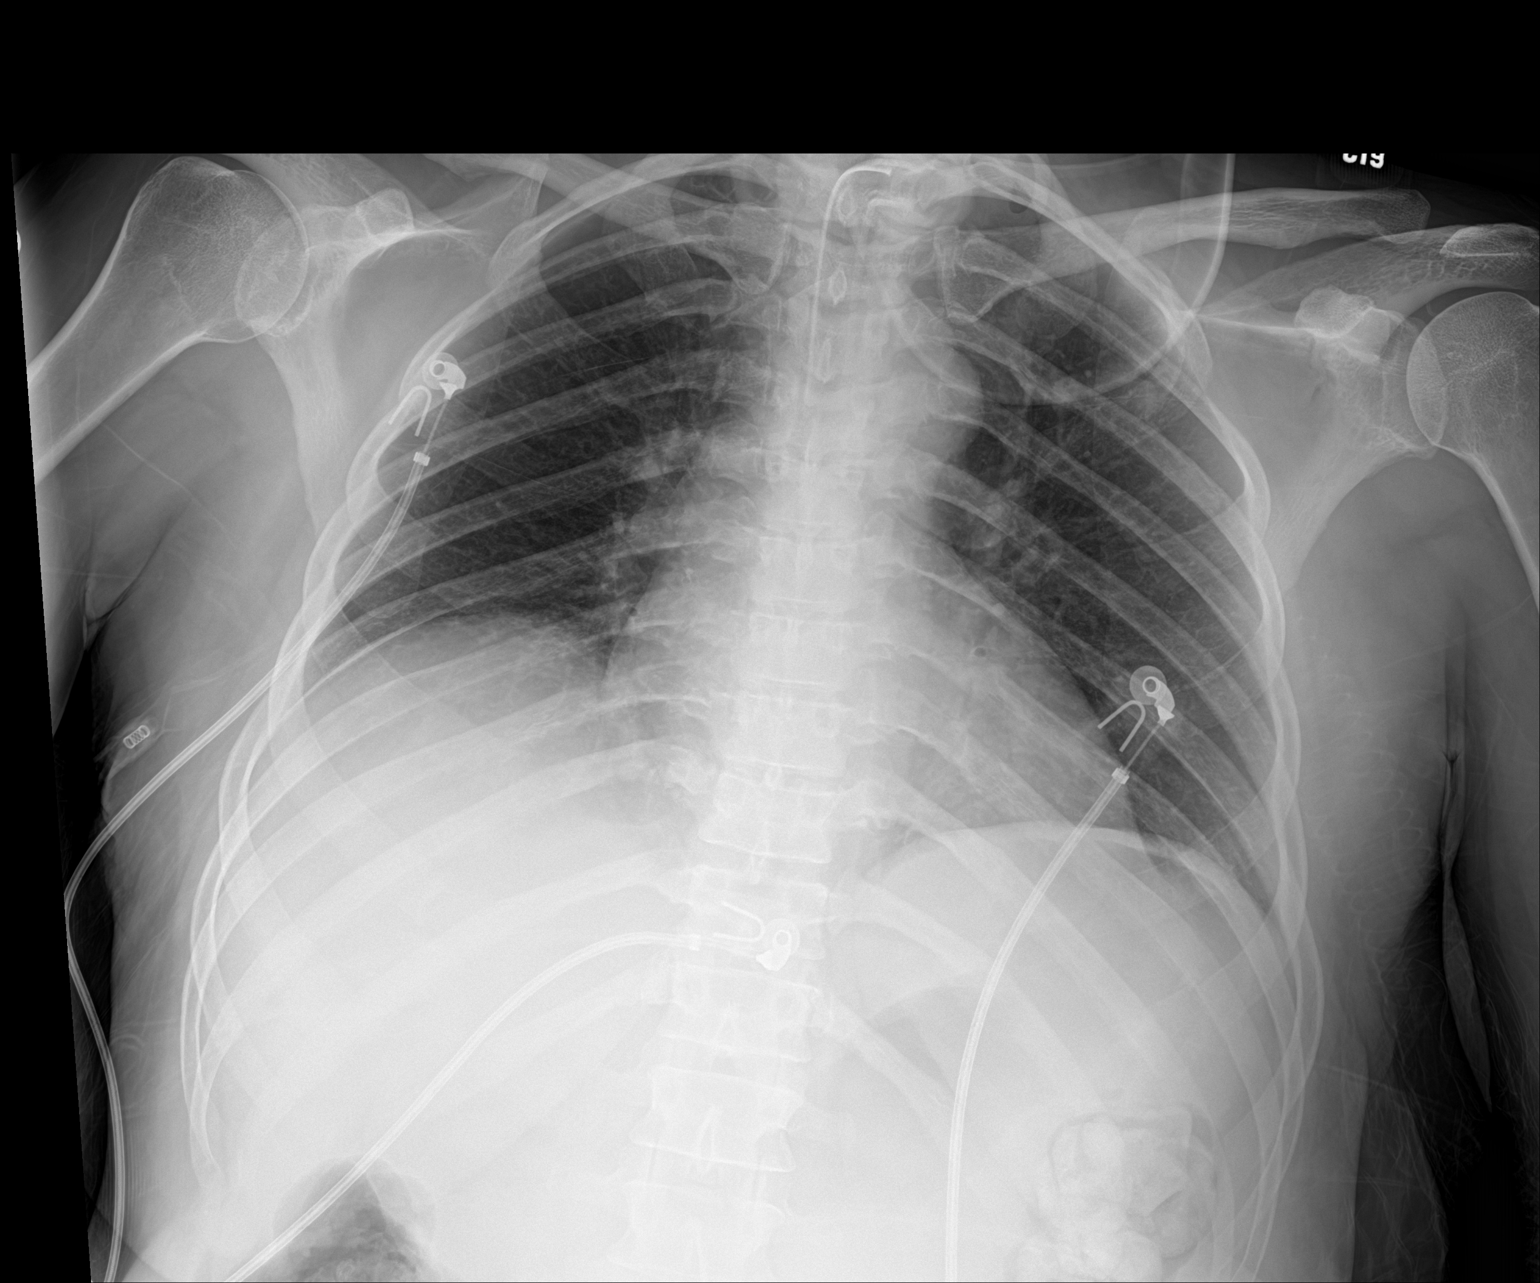

[1 of 1 positions shown; findings below may reference images not displayed]

FINDINGS: Cardiac silhouette is normal in size. Tracheostomy tube is stable,
tip projecting 1.5 cm above the carinal. No mediastinal or hilar
masses.

Clear lungs.  No pleural effusion or pneumothorax.

Skeletal structures are grossly intact.
IMPRESSION: No active disease.

## 2023-06-25 NOTE — Progress Notes (Signed)
NEW PATIENT Date of Service/Encounter:  06/27/23 Referring provider: [facility] Primary care provider: [facility]  Subjective:  Molly Frazier is a 52 y.o. female with a PMHx of uterine cancer, PE, chronic HFrEF, laryngeal stenosis, tracheostomy dependent, COPD presenting today for evaluation of allergic rhinitis and cough. History obtained from: chart review and patient and caregiver .   Allergic rhinitis:  Ears feel clogged and itchy. This is her main symptoms. Year round. 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 She started injections around 2015, and these were helpful when she was on them. She tolerated them well.   Her previous allergy injections were written for tree pollen, cat, grass and weed pollen.  Her daughter has a cat in the home now. She was prevoiusly on allegra and it was helpful. She is not currently on any medications.   She is not following with pulmonary. She has felt like her breathing has been okay. She has not been on an inhaler in many years. Last used in 2020 when getting her allergy injections prior to having her MI. She does sometimes cough at nigth due to mucus production.  She does not follow with pulmonary but has extensive cardiac and pulmonary history including chronic heart failure and hx of PE and COPD.  She also states that raw tomatoes make her mouth and tongue itch, but she can tolerate ketchup or tomato in sauces.  Chart review: Her last visit to our clinic was in 2020 with Dr. Nunzio Cobbs.  She was on allergy injections but starting prior to 2016 until 2020.  At her last visit her asthma was reported as stable, and she was being followed by Dr. Eulis Foster and pulmonary.  AIT was continued, and recommended to use Allegra as needed.  Past Medical History: Past Medical History:  Diagnosis Date   Acute on chronic respiratory failure with hypoxia (HCC)    Asthma     Chronic HFrEF (heart failure with reduced ejection fraction) (HCC)    Family history of breast cancer    Family history of colon cancer    History of uterine cancer    Dx at age 29   Patient is Jehovah's Witness    Pulmonary embolism on right [facility])    Subglottic stenosis    Medication List:  Current Outpatient Medications  Medication Sig Dispense Refill   amitriptyline (ELAVIL) 50 MG tablet TAKE 1 TABLET BY MOUTH EVERYDAY AT BEDTIME  2   aspirin 81 MG chewable tablet Chew 1 tablet by mouth daily.     BELBUCA 300 MCG FILM Take by mouth.     bisacodyl (DULCOLAX) 5 MG EC tablet TAKE 1 TABLET BY MOUTH DAILY AS NEEDED FOR CONSTIPATION**OTC     carbamazepine (CARBATROL) 200 MG 12 hr capsule Take 200 mg by mouth 2 (two) times daily. Reported on 11/28/2015     ciclopirox (PENLAC) 8 % solution      Feeding Tubes (FEEDING TUBE ATTACHMENT DEVICE) MISC Ankle immobilizer for right ankle     furosemide (LASIX) 20 MG tablet Take 20 mg by mouth daily.     lidocaine (XYLOCAINE) 5 % ointment Apply topically.     loxapine (LOXITANE) 50 MG capsule Take 50 mg by mouth 2 (two) times daily.     metFORMIN (GLUCOPHAGE-XR) 500 MG 24 hr tablet Take 500 mg by mouth daily.     metoprolol succinate (TOPROL-XL) 50 MG 24 hr tablet Take by  mouth.     mirtazapine (REMERON) 30 MG tablet Take by mouth.     nystatin (MYCOSTATIN/NYSTOP) powder APPLY TOPICALLY TO AFFECTED AREAS TWICE DAILY AS NEEDED TO RASH     omeprazole (PRILOSEC) 40 MG capsule Take by mouth.     potassium chloride (KLOR-CON) 10 MEQ tablet Take 10 mEq by mouth daily.     pregabalin (LYRICA) 200 MG capsule Take 200 mg by mouth 3 (three) times daily.     topiramate (TOPAMAX) 50 MG tablet Take by mouth.     traZODone (DESYREL) 50 MG tablet Take 100 mg by mouth at bedtime as needed.     VRAYLAR 1.5 MG capsule Take 1.5 mg by mouth daily.     No current facility-administered medications for this visit.   Known Allergies:  Allergies  Allergen Reactions    Wellbutrin [Bupropion]     Other reaction(s): Other (See Comments) Pt has seizures   Benadryl [Diphenhydramine Hcl] Rash   Oxycodone Nausea And Vomiting   Pregabalin Other (See Comments)    Seizure   Vicodin [Hydrocodone-Acetaminophen] Rash   Past Surgical History: Past Surgical History:  Procedure Laterality Date   ESOPHAGEAL DILATION  09/09/2015   pace marker     TRACHELECTOMY     Family History: Family History  Problem Relation Age of Onset   Breast cancer Mother 8       currently in her 75s; TAH/BSO ~50   Colon cancer Sister 103   Social History: Molly Frazier lives in a house that is old with water damage, wood floors, gas heating, indoor cat that is her daughters, not using dust mite protection on the bedding or pillows, no smoke exposure.  She is disabled and not working.  No HEPA filter in the home.  [place]  She does not smoke and has not smoked.   ROS:  All other systems negative except as noted per HPI.  Objective:  Blood pressure 104/76, pulse 88, temperature 98.4 F (36.9 C), temperature source Temporal, resp. rate 18, SpO2 99%. There is no height or weight on file to calculate BMI. Physical Exam:  General Appearance:  Alert, cooperative, no distress, appears stated age  Head:  Normocephalic, without obvious abnormality, atraumatic  Eyes:  Conjunctiva clear, EOM's intact  Nose: Nares normal, hypertrophic turbinates and no visible anterior polyps  Throat: Lips, tongue normal; teeth and gums normal, normal posterior oropharynx  Neck: Supple, symmetrical, tracheostomy tube in place  Lungs:   clear to auscultation bilaterally, Respirations unlabored, no coughing  Heart:  regular rate and rhythm and no murmur, Appears well perfused  Extremities: No edema  Skin: Skin color, texture, turgor normal and no rashes or lesions on visualized portions of skin, scattered hyperpigmented macules on face  Neurologic: No gross deficits    Diagnostics: Labs: environmental allergy panel, total IgE level  Assessment and Plan  History of seasonal perennial allergic rhinitis, likely not controlled due to having a cat at home.  Discussed medical management with medications as below.  Given her complicated medical history, she is no longer a candidate for allergy injections.  Will update her allergy testing today via blood work. She is having some coughing at night due to mucus which could be related to her tracheostomy.  However she does have a prior history of asthma/COPD previously managed by pulmonary.  Will provide her with an albuterol inhaler, and recommend she follow-up with a pulmonologist given her complicated cardiac and pulmonary history. She also reports mouth and  throat symptoms with raw tomatoes, but not cooked tomatoes.  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  Chronic Rhinitis: Seasonal perennial allergic rhinitis - allergy testing today: via blood work - previously on allergy injections for tree pollen, cat, grass pollen and weed pollen. - Prevention:  - allergen avoidance when possible - unfortunately you are no longer a candidate for allergy injections - Symptom control: - Start Flonase (fluticasone)1- 2 sprays in each nostril daily.  - Start Astelin (azelastine) 1-2 sprays in each nostril twice a day as needed for nasal congestion/itchy nose - Start Zyrtec (Cetirizine) 10mg  daily   Allergic Conjunctivitis:  - Start Allergy Eye drops-great options include Pataday (Olopatadine) or Zaditor (ketotifen) for eye symptoms daily as needed-both sold over the counter if not covered by insurance.  -Avoid eye drops that say red eye relief as they may contain medications that dry out your eyes.  Coughing at night-hx of pulmonary embolism, tracheostomy dependent, and chronic heart failure, hx of mild asthma - will provide albuterol to be used 2-4 puffs every 4 hours as  needed - will refer to pulmonary for further work-up  Oral Allergy Syndrome:avoid raw tomatoes - These symptoms are typically not life-threatening and are because of a cross reaction between a pollen you are allergic to, and to a protein in specific foods (such as fresh fruits, vegetables, and nuts). - If you can eat these things and tolerate the symptoms, it is fine to continue to do so.  If not, you may avoid these fresh fruits and vegetables.   - Heating these foods, buying them canned, and peeling these foods should allow them to be consumed without symptoms or with less symptoms.  Follow up : 3 months, sooner if needed It was a pleasure meeting you in clinic today! Thank you for allowing me to participate in your care. This note in its entirety was forwarded to the Provider who requested this consultation.  Thank you for your kind referral. I appreciate the opportunity to take part in Chavy's care. Please do not hesitate to contact me with questions.  Sincerely,  Tonny Bollman, MD [facility]

## 2023-06-27 ENCOUNTER — Ambulatory Visit (INDEPENDENT_AMBULATORY_CARE_PROVIDER_SITE_OTHER): Payer: 59 | Admitting: Internal Medicine

## 2023-06-27 ENCOUNTER — Encounter: Payer: Self-pay | Admitting: Internal Medicine

## 2023-06-27 VITALS — BP 104/76 | HR 88 | Temp 98.4°F | Resp 18

## 2023-06-27 DIAGNOSIS — H1013 Acute atopic conjunctivitis, bilateral: Secondary | ICD-10-CM | POA: Insufficient documentation

## 2023-06-27 DIAGNOSIS — J3089 Other allergic rhinitis: Secondary | ICD-10-CM

## 2023-06-27 DIAGNOSIS — J302 Other seasonal allergic rhinitis: Secondary | ICD-10-CM | POA: Insufficient documentation

## 2023-06-27 DIAGNOSIS — T781XXA Other adverse food reactions, not elsewhere classified, initial encounter: Secondary | ICD-10-CM | POA: Diagnosis not present

## 2023-06-27 DIAGNOSIS — J449 Chronic obstructive pulmonary disease, unspecified: Secondary | ICD-10-CM | POA: Diagnosis not present

## 2023-06-27 DIAGNOSIS — J4530 Mild persistent asthma, uncomplicated: Secondary | ICD-10-CM

## 2023-06-27 MED ORDER — CETIRIZINE HCL 10 MG PO TABS: 10.0000 mg | ORAL_TABLET | Freq: Every day | ORAL | 5 refills | Status: DC

## 2023-06-27 MED ORDER — OLOPATADINE HCL 0.2 % OP SOLN: 1.0000 [drp] | Freq: Every day | OPHTHALMIC | 5 refills | Status: DC | PRN

## 2023-06-27 MED ORDER — FLUTICASONE PROPIONATE 50 MCG/ACT NA SUSP: 1.0000 | Freq: Every day | NASAL | 5 refills | Status: DC

## 2023-06-27 MED ORDER — ALBUTEROL SULFATE HFA 108 (90 BASE) MCG/ACT IN AERS: 2.0000 | INHALATION_SPRAY | RESPIRATORY_TRACT | 2 refills | Status: DC | PRN

## 2023-06-27 MED ORDER — AZELASTINE HCL 0.1 % NA SOLN: 1.0000 | Freq: Two times a day (BID) | NASAL | 5 refills | Status: DC | PRN

## 2023-06-27 NOTE — Patient Instructions (Addendum)
Chronic Rhinitis: - allergy testing today: via blood work - previously on allergy injections for tree pollen, cat, grass pollen and weed pollen. - Prevention:  - allergen avoidance when possible - unfortunately you are no longer a candidate for allergy injections - Symptom control: - Start Flonase (fluticasone)1- 2 sprays in each nostril daily.  - Start Astelin (azelastine) 1-2 sprays in each nostril twice a day as needed for nasal congestion/itchy nose - Start Zyrtec (Cetirizine) 10mg  daily   Allergic Conjunctivitis:  - Start Allergy Eye drops-great options include Pataday (Olopatadine) or Zaditor (ketotifen) for eye symptoms daily as needed-both sold over the counter if not covered by insurance.  -Avoid eye drops that say red eye relief as they may contain medications that dry out your eyes.  Coughing at night-hx of pulmonary embolism, tracheostomy dependent, and chronic heart failure, hx of mild asthma - will provide albuterol to be used 2-4 puffs every 4 hours as needed - will refer to pulmonary for further work-up  Oral Allergy Syndrome:avoid raw tomatoes - These symptoms are typically not life-threatening and are because of a cross reaction between a pollen you are allergic to, and to a protein in specific foods (such as fresh fruits, vegetables, and nuts). - If you can eat these things and tolerate the symptoms, it is fine to continue to do so.  If not, you may avoid these fresh fruits and vegetables.   - Heating these foods, buying them canned, and peeling these foods should allow them to be consumed without symptoms or with less symptoms.   Follow up : 3 months, sooner if needed It was a pleasure meeting you in clinic today! Thank you for allowing me to participate in your care.  Tonny Bollman, MD [facility]

## 2023-07-01 LAB — ALLERGENS, ZONE 2
Alternaria Alternata IgE: <0.1 kU/L
Amer Sycamore IgE Qn: 0.26 kU/L — AB
Aspergillus Fumigatus IgE: <0.1 kU/L
Bahia Grass IgE: 0.73 kU/L — AB
Bermuda Grass IgE: 1.54 kU/L — AB
Cat Dander IgE: 2.06 kU/L — AB
Cedar, Mountain IgE: 0.18 kU/L — AB
Cladosporium Herbarum IgE: 0.11 kU/L — AB
Cockroach, American IgE: <0.1 kU/L
Common Silver Birch IgE: 11.3 kU/L — AB
D Farinae IgE: <0.1 kU/L
D Pteronyssinus IgE: <0.1 kU/L
Dog Dander IgE: 1.41 kU/L — AB
Elm, American IgE: 0.27 kU/L — AB
Hickory, White IgE: 0.27 kU/L — AB
Johnson Grass IgE: 0.65 kU/L — AB
Maple/Box Elder IgE: 0.25 kU/L — AB
Mucor Racemosus IgE: <0.1 kU/L
Mugwort IgE Qn: <0.1 kU/L
Nettle IgE: 0.18 kU/L — AB
Oak, White IgE: 8.05 kU/L — AB
Penicillium Chrysogen IgE: <0.1 kU/L
Pigweed, Rough IgE: 0.23 kU/L — AB
Plantain, English IgE: 0.23 kU/L — AB
Ragweed, Short IgE: 2.05 kU/L — AB
Sheep Sorrel IgE Qn: 0.2 kU/L — AB
Stemphylium Herbarum IgE: <0.1 kU/L
Sweet gum IgE RAST Ql: 0.71 kU/L — AB
Timothy Grass IgE: 1.51 kU/L — AB
White Mulberry IgE: <0.1 kU/L

## 2023-07-01 LAB — IGE: IgE (Immunoglobulin E), Serum: 1442 IU/mL — ABNORMAL HIGH (ref 6–495)

## 2023-07-01 NOTE — Progress Notes (Signed)
Please let Ms. Molly Frazier know that her allergy testing returned positive to: cat, dog, grass pollen, tree pollen, ragweed pollen, weed pollen. Please mail avoidance measures for pets and pollen.  Have her continue with plan as discussed in clinic.  Pay close attention to cat avoidance measures. Let me know if she has any questions.

## 2023-07-03 ENCOUNTER — Other Ambulatory Visit: Payer: Self-pay

## 2023-07-04 ENCOUNTER — Other Ambulatory Visit: Payer: Self-pay

## 2023-07-04 ENCOUNTER — Telehealth: Payer: Self-pay

## 2023-07-04 MED ORDER — AZELASTINE HCL 0.1 % NA SOLN: 1.0000 | Freq: Two times a day (BID) | NASAL | 5 refills | Status: DC | PRN

## 2023-07-04 MED ORDER — CETIRIZINE HCL 10 MG PO TABS: 10.0000 mg | ORAL_TABLET | Freq: Every day | ORAL | 5 refills | Status: DC

## 2023-07-04 MED ORDER — ALBUTEROL SULFATE HFA 108 (90 BASE) MCG/ACT IN AERS: 2.0000 | INHALATION_SPRAY | RESPIRATORY_TRACT | 2 refills | Status: DC | PRN

## 2023-07-04 MED ORDER — OLOPATADINE HCL 0.2 % OP SOLN: 1.0000 [drp] | Freq: Every day | OPHTHALMIC | 5 refills | Status: DC | PRN

## 2023-07-04 MED ORDER — FLUTICASONE PROPIONATE 50 MCG/ACT NA SUSP: 1.0000 | Freq: Every day | NASAL | 5 refills | Status: DC

## 2023-07-04 NOTE — Telephone Encounter (Signed)
Pt med's from OV 7/18 that was sent to [facility], didn't go through after calling [facility], pt daughter was able to get in contact with pt and due wrong phone numbers, but phone is now updated and medication resend to [facility] pharmacy.

## 2023-07-10 ENCOUNTER — Telehealth: Payer: Self-pay

## 2023-07-10 NOTE — Telephone Encounter (Signed)
I called [phone] that was on file and was told we had the wrong number. I called [phone] and left a voicemail for the patient or her family to contact me back.  I have placed her referral to   [facility] [address] [place],  [place]  [address] Main: [phone]  If she would like to be closer to home, I can change her referral to   [facility]  [address] [place], [place] [address] Phone: [phone]

## 2023-07-10 NOTE — Telephone Encounter (Signed)
-----   Message from [facility] F sent at 06/27/2023  1:59 PM EDT -----  ----- Message ----- From: Verlee Monte, MD Sent: 06/27/2023   1:39 PM EDT To: [facility]  Can we refer to Pulm for asthma/copd? She has tracheostomy, heart failure and hx of PE.

## 2023-07-17 NOTE — Telephone Encounter (Signed)
Patient called back and she would like to see her current pulmonary provider with [facility]. She is going to call them to get scheduled and I will send her office notes there.   The number that we have on file for her is correct.

## 2023-09-17 ENCOUNTER — Other Ambulatory Visit: Payer: Self-pay | Admitting: Internal Medicine

## 2023-09-27 ENCOUNTER — Encounter: Payer: Self-pay | Admitting: Internal Medicine

## 2023-09-27 ENCOUNTER — Ambulatory Visit (INDEPENDENT_AMBULATORY_CARE_PROVIDER_SITE_OTHER): Payer: 59 | Admitting: Internal Medicine

## 2023-09-27 VITALS — BP 110/62 | HR 71 | Temp 97.9°F | Resp 18

## 2023-09-27 DIAGNOSIS — J309 Allergic rhinitis, unspecified: Secondary | ICD-10-CM

## 2023-09-27 DIAGNOSIS — J4489 Other specified chronic obstructive pulmonary disease: Secondary | ICD-10-CM | POA: Diagnosis not present

## 2023-09-27 DIAGNOSIS — H1013 Acute atopic conjunctivitis, bilateral: Secondary | ICD-10-CM

## 2023-09-27 DIAGNOSIS — T781XXD Other adverse food reactions, not elsewhere classified, subsequent encounter: Secondary | ICD-10-CM | POA: Diagnosis not present

## 2023-09-27 MED ORDER — OLOPATADINE HCL 0.2 % OP SOLN: 1.0000 [drp] | Freq: Every day | OPHTHALMIC | 5 refills | Status: AC | PRN

## 2023-09-27 MED ORDER — NEBULIZER/TUBING/MOUTHPIECE KIT: PACK | 1 refills | Status: AC

## 2023-09-27 MED ORDER — AZELASTINE HCL 0.1 % NA SOLN: 1.0000 | Freq: Two times a day (BID) | NASAL | 5 refills | Status: DC | PRN

## 2023-09-27 MED ORDER — CETIRIZINE HCL 10 MG PO TABS: 10.0000 mg | ORAL_TABLET | Freq: Every day | ORAL | 5 refills | Status: DC

## 2023-09-27 MED ORDER — ALBUTEROL SULFATE (2.5 MG/3ML) 0.083% IN NEBU: 2.5000 mg | INHALATION_SOLUTION | RESPIRATORY_TRACT | 1 refills | Status: DC | PRN

## 2023-09-27 MED ORDER — FLUTICASONE PROPIONATE 50 MCG/ACT NA SUSP: 1.0000 | Freq: Every day | NASAL | 5 refills | Status: DC

## 2023-09-27 NOTE — Patient Instructions (Addendum)
Allergic Rhinitis: - Serum environmental panel (06/27/23): positive to cat, dog, grass pollen, tree pollen, ragweed pollen, weed pollen  - Prevention:  - allergen avoidance when possible - unfortunately you are no longer a candidate for allergy injections - Symptom control: - Continue Flonase (fluticasone)1- 2 sprays in each nostril daily.  - Continue Astelin (azelastine) 1-2 sprays in each nostril twice a day as needed for nasal congestion/itchy nose - Continue Zyrtec (Cetirizine) 10mg  daily   Allergic Conjunctivitis:  - Continue Allergy Eye drops-great options include Pataday (Olopatadine) or Zaditor (ketotifen) for eye symptoms daily as needed-both sold over the counter if not covered by insurance.  -Avoid eye drops that say red eye relief as they may contain medications that dry out your eyes.  Coughing at night-hx of pulmonary embolism, tracheostomy dependent, and chronic heart failure, hx of mild asthma - will provide albuterol to be used 2-4 puffs every 4 hours as needed - will send in nebulizer machine with albuterol to be used in nebulizer 1 vial every 4 hours as needed. - follow-up with pulmonary for further work-up  Oral Allergy Syndrome:avoid raw tomatoes - These symptoms are typically not life-threatening and are because of a cross reaction between a pollen you are allergic to, and to a protein in specific foods (such as fresh fruits, vegetables, and nuts). - If you can eat these things and tolerate the symptoms, it is fine to continue to do so.  If not, you may avoid these fresh fruits and vegetables.   - Heating these foods, buying them canned, and peeling these foods should allow them to be consumed without symptoms or with less symptoms.   Follow up : 6 months, sooner if needed It was a pleasure seeing you again in clinic today! Thank you for allowing me to participate in your care.  Tonny Bollman, MD [facility]

## 2023-09-27 NOTE — Progress Notes (Signed)
FOLLOW UP Date of Service/Encounter:  09/27/23  Subjective:  Molly Frazier (DOB: 1971/10/26) is a 52 y.o. female who [facility] in re-evaluation of the following: allergic rhinitis and cough.  History obtained from: chart review and patient and caregiver .  For Review, LV was on 06/27/23  with Dr.Dontrell Stuck seen for intial visit for allergic rhinitis and chronic cough, pollen food allergy . See below for summary of history and diagnostics.   Therapeutic plans/changes recommended:  Start: flonase, astelin and zyrtec daily. Pataday eye drops as needed. No longer a candidate for AIT due to complicated medical history. Referred to pulmonary and provided albuterol to use as needed ----------------------------------------------------- Pertinent History/Diagnostics:  COPD/hx of PE/trach dependence/CHF;hx of mild asthma : She has not been on an inhaler in many years. Last used in 2020 when getting her allergy injections prior to having her MI. She does sometimes cough at nigth due to mucus production.  She does not follow with pulmonary but has extensive cardiac and pulmonary history including chronic heart failure and hx of PE and COPD.  Allergic Rhinitis:  Perennial ears feeling congested and itchy.  On AIT starting around 2015 but stopped in 2020 after having an MI developed being COVID and ultimately now with tracheostomy.  AIT was helpful for her and it was well-tolerated. New cat in the home which she suspects is worsening symptoms. - Serum environmental panel (06/27/23): positive to cat, dog, grass pollen, tree pollen, ragweed pollen, weed pollen  Pollen Food allergy :  raw tomatoes make her mouth and tongue itch, but she can tolerate ketchup or tomato in sauces  Other: uterine cancer, PE, chronic HFrEF, laryngeal stenosis, tracheostomy dependent, COPD  --------------------------------------------------- Today presents for follow-up. Discussed the  use of AI scribe software for clinical note transcription with the patient, who gave verbal consent to proceed.  History of Present Illness   The patient, with a history of allergies, reports continued exposure to pets in the home, including a cat and a dog. Despite the removal of the cat to outdoors, the patient's symptoms have only slightly improved. She has been managing her symptoms with daily medications, including Zyrtec, Flonase, and azelastine nasal sprays.  The patient also reports some ocular symptoms, which have been managed with eye drops. She notes that her symptoms are less severe in the morning.  In addition to her allergies, the patient has a tracheostomy, which is managed by an ear, nose, and throat specialist. She reports good control of her breathing and has not needed to use her prescribed albuterol. An appointment with a lung specialist was scheduled but was cancelled by the provider. She has not rescheduled. Her caregiver asks if she can have a nebulizer machine.     All medications reviewed by clinical staff and updated in chart. No new pertinent medical or surgical history except as noted in HPI.  ROS: All others negative except as noted per HPI.   Objective:  BP 110/62   Pulse 71   Temp 97.9 F (36.6 C) (Temporal)   Resp 18   SpO2 99%  There is no height or weight on file to calculate BMI. Physical Exam: General Appearance:  Alert, cooperative, no distress, appears stated age  Head:  Normocephalic, without obvious abnormality, atraumatic  Eyes:  Conjunctiva clear, EOM's intact  Ears EACs normal bilaterally and normal TMs bilaterally  Nose: Nares normal, hypertrophic turbinates, normal mucosa, and no visible anterior polyps  Throat: Lips, tongue  normal; teeth and gums normal, normal posterior oropharynx  Neck: Supple, symmetrical, tracheostomy tube present  Lungs:   clear to auscultation bilaterally, Respirations unlabored, no coughing  Heart:  regular rate and  rhythm and no murmur, Appears well perfused  Extremities: No edema  Skin: Skin color, texture, turgor normal and no rashes or lesions on visualized portions of skin  Neurologic: No gross deficits   Labs:  Lab Orders  No laboratory test(s) ordered today    Assessment/Plan   Allergic Rhinitis: Positive allergy testing to cats, dogs, grass, tree, ragweed, and weeds. Continued exposure to pets in the home. Currently on Zyrtec, Flonase, and azelastine with some improvement. - Serum environmental panel (06/27/23): positive to cat, dog, grass pollen, tree pollen, ragweed pollen, weed pollen  - Prevention:  - allergen avoidance when possible - unfortunately you are no longer a candidate for allergy injections - Symptom control: - Continue Flonase (fluticasone)1- 2 sprays in each nostril daily.  - Continue Astelin (azelastine) 1-2 sprays in each nostril twice a day as needed for nasal congestion/itchy nose - Continue Zyrtec (Cetirizine) 10mg  daily   Allergic Conjunctivitis:  - Continue Allergy Eye drops-great options include Pataday (Olopatadine) or Zaditor (ketotifen) for eye symptoms daily as needed-both sold over the counter if not covered by insurance.  -Avoid eye drops that say red eye relief as they may contain medications that dry out your eyes.  Coughing at night-hx of pulmonary embolism, tracheostomy dependent, and chronic heart failure, hx of mild asthma - will provide albuterol to be used 2-4 puffs every 4 hours as needed - will send in nebulizer machine with albuterol to be used in nebulizer 1 vial every 4 hours as needed. - follow-up with pulmonary for further work-up  Oral Allergy Syndrome:avoid raw tomatoes - These symptoms are typically not life-threatening and are because of a cross reaction between a pollen you are allergic to, and to a protein in specific foods (such as fresh fruits, vegetables, and nuts). - If you can eat these things and tolerate the symptoms, it is fine  to continue to do so.  If not, you may avoid these fresh fruits and vegetables.   - Heating these foods, buying them canned, and peeling these foods should allow them to be consumed without symptoms or with less symptoms.  Follow up : 6 months, sooner if needed It was a pleasure seeing you again in clinic today! Thank you for allowing me to participate in your care.  Other: none  Tonny Bollman, MD  [facility]

## 2023-11-15 ENCOUNTER — Telehealth: Payer: Self-pay | Admitting: Internal Medicine

## 2023-11-15 NOTE — Telephone Encounter (Signed)
Logan placed pt's nebulizer at the front desk and stated pts nurse would be picking it up. Nebulizer picked by pt's nurse, pt is not physically able to pick up machine herself, scanned nurses Driver's license, paperwork signed by pt's nurse.

## 2023-11-22 ENCOUNTER — Other Ambulatory Visit: Payer: Self-pay | Admitting: Internal Medicine

## 2024-04-01 ENCOUNTER — Ambulatory Visit: Payer: 59 | Admitting: Internal Medicine

## 2024-04-01 ENCOUNTER — Encounter: Payer: Self-pay | Admitting: Internal Medicine

## 2024-04-01 VITALS — BP 142/88 | HR 63 | Temp 98.1°F | Resp 20 | Wt 261.0 lb

## 2024-04-01 DIAGNOSIS — H1013 Acute atopic conjunctivitis, bilateral: Secondary | ICD-10-CM | POA: Diagnosis not present

## 2024-04-01 DIAGNOSIS — J386 Stenosis of larynx: Secondary | ICD-10-CM

## 2024-04-01 DIAGNOSIS — J309 Allergic rhinitis, unspecified: Secondary | ICD-10-CM | POA: Diagnosis not present

## 2024-04-01 DIAGNOSIS — H1010 Acute atopic conjunctivitis, unspecified eye: Secondary | ICD-10-CM

## 2024-04-01 DIAGNOSIS — I2699 Other pulmonary embolism without acute cor pulmonale: Secondary | ICD-10-CM

## 2024-04-01 DIAGNOSIS — T781XXD Other adverse food reactions, not elsewhere classified, subsequent encounter: Secondary | ICD-10-CM | POA: Diagnosis not present

## 2024-04-01 DIAGNOSIS — I5022 Chronic systolic (congestive) heart failure: Secondary | ICD-10-CM

## 2024-04-01 MED ORDER — CETIRIZINE HCL 10 MG PO TABS: 10.0000 mg | ORAL_TABLET | Freq: Every day | ORAL | 11 refills | Status: AC

## 2024-04-01 NOTE — Patient Instructions (Addendum)
 Allergic Rhinitis: - Serum environmental panel (06/27/23): positive to cat, dog, grass pollen, tree pollen, ragweed pollen, weed pollen  - Prevention:  - allergen avoidance when possible - unfortunately you are no longer a candidate for allergy injections - Symptom control: - Continue Flonase  (fluticasone )1- 2 sprays in each nostril daily.  - Continue Astelin  (azelastine ) 1-2 sprays in each nostril twice a day as needed for nasal congestion/itchy nose - Continue Zyrtec  (Cetirizine ) 10mg  daily   Allergic Conjunctivitis:  - Continue Allergy Eye drops-great options include Pataday  (Olopatadine ) or Zaditor (ketotifen) for eye symptoms daily as needed-both sold over the counter if not covered by insurance.  -Avoid eye drops that say red eye relief as they may contain medications that dry out your eyes.  Hx of pulmonary embolism, tracheostomy dependent, and chronic heart failure, hx of mild asthma - follow-up with pulmonary as scheduled - no longer a candidate for allergy injections  Oral Allergy Syndrome:avoid raw tomatoes - These symptoms are typically not life-threatening and are because of a cross reaction between a pollen you are allergic to, and to a protein in specific foods (such as fresh fruits, vegetables, and nuts). - If you can eat these things and tolerate the symptoms, it is fine to continue to do so.  If not, you may avoid these fresh fruits and vegetables.   - Heating these foods, buying them canned, and peeling these foods should allow them to be consumed without symptoms or with less symptoms.   Follow up : 12 months, sooner if needed It was a pleasure seeing you again in clinic today! Thank you for allowing me to participate in your care.  Jonathon Neighbors, MD [facility]

## 2024-04-01 NOTE — Progress Notes (Signed)
 FOLLOW UP Date of Service/Encounter:  04/01/24  Subjective:  Molly Frazier (DOB: [birth year]) is a 53 y.o. female who [facility] in re-evaluation of the following: allergic rhinoconjuncitivitis History obtained from: chart review and patient.  For Review, LV was on 09/27/23  with Dr.Ralphael Southgate seen for routine follow-up. See below for summary of history and diagnostics.   Therapeutic plans/changes recommended: We recommended she follow-up with pulmonary for her breathing issues given the complexity of her situation.  We continued with allergy medications to control allergic rhinitis symptoms. ----------------------------------------------------- Pertinent History/Diagnostics:  COPD/hx of PE/trach dependence/CHF;hx of mild asthma : She has not been on an inhaler in many years. Last used in 2020 when getting her allergy injections prior to having her MI. She does sometimes cough at nigth due to mucus production.  She does not follow with pulmonary but has extensive cardiac and pulmonary history including chronic heart failure and hx of PE and COPD.  Allergic Rhinitis:  Perennial ears feeling congested and itchy.  On AIT starting around 2015 but stopped in 2020 after having an MI developed being COVID and ultimately now with tracheostomy.  AIT was helpful for her and it was well-tolerated. New cat in the home which she suspects is worsening symptoms. - Serum environmental panel (06/27/23): positive to cat, dog, grass pollen, tree pollen, ragweed pollen, weed pollen  Pollen Food allergy :  raw tomatoes make her mouth and tongue itch, but she can tolerate ketchup or tomato in sauces  Other: uterine cancer, PE, chronic HFrEF, laryngeal stenosis, tracheostomy dependent, COPD  --------------------------------------------------- Today presents for follow-up.  History of Present Illness   Molly Frazier is a 53 year old female who presents for allergy  management.  She manages her allergies during the pollen season using cetirizine  as needed. She has not required nasal sprays or eye drops recently.  She has a history of a pulmonary condition and was previously on oxygen therapy, which was discontinued in August. She does not use inhalers daily.  She does follow with pulmonary now with her last visit being in August 2024.  She feels her allergies are manageable and has had a good allergy season.  She does not need any refills at this time.      All medications reviewed by clinical staff and updated in chart. No new pertinent medical or surgical history except as noted in HPI.  ROS: All others negative except as noted per HPI.   Objective:  BP (!) 142/88   Pulse 63   Temp 98.1 F (36.7 C) (Temporal)   Resp 20   Wt 261 lb (118.4 kg)   SpO2 96%   BMI 46.98 kg/m  Body mass index is 46.98 kg/m. Physical Exam: General Appearance:  Alert, cooperative, no distress, appears stated age  Head:  Normocephalic, without obvious abnormality, atraumatic  Eyes:  Conjunctiva clear, EOM's intact  Ears EACs normal bilaterally and normal TMs bilaterally  Nose: Nares normal,  pale boggy, hypertrophic turbinates, normal mucosa, and no visible anterior polyps  Throat: Lips, tongue normal; teeth and gums normal, normal posterior oropharynx  Neck: Supple, symmetrical, tracheostomy in place  Lungs:   clear to auscultation bilaterally, Respirations unlabored, no coughing  Heart:  regular rate and rhythm and no murmur, Appears well perfused  Extremities: No edema  Skin: Skin color, texture, turgor normal and no rashes or lesions on visualized portions of skin  Neurologic: No gross deficits   Labs:  Lab  Orders  No laboratory test(s) ordered today    Assessment/Plan   Allergic Rhinitis:at goal - Serum environmental panel (06/27/23): positive to cat, dog, grass pollen, tree pollen, ragweed pollen, weed pollen  - Prevention:  - allergen avoidance  when possible - unfortunately you are no longer a candidate for allergy injections - Symptom control: - Continue Flonase  (fluticasone )1- 2 sprays in each nostril daily.  - Continue Astelin  (azelastine ) 1-2 sprays in each nostril twice a day as needed for nasal congestion/itchy nose - Continue Zyrtec  (Cetirizine ) 10mg  daily   Allergic Conjunctivitis: at goal - Continue Allergy Eye drops-great options include Pataday  (Olopatadine ) or Zaditor (ketotifen) for eye symptoms daily as needed-both sold over the counter if not covered by insurance.  -Avoid eye drops that say red eye relief as they may contain medications that dry out your eyes.  Hx of pulmonary embolism, tracheostomy dependent, and chronic heart failure, hx of mild asthma-managed by Pulmonary - follow-up with pulmonary as scheduled - no longer a candidate for allergy injections  Oral Allergy Syndrome:avoid raw tomatoes-stable - These symptoms are typically not life-threatening and are because of a cross reaction between a pollen you are allergic to, and to a protein in specific foods (such as fresh fruits, vegetables, and nuts). - If you can eat these things and tolerate the symptoms, it is fine to continue to do so.  If not, you may avoid these fresh fruits and vegetables.   - Heating these foods, buying them canned, and peeling these foods should allow them to be consumed without symptoms or with less symptoms.   Follow up : 12 months, sooner if needed It was a pleasure seeing you again in clinic today! Thank you for allowing me to participate in your care.  Other: none  Jonathon Neighbors, MD  [facility] 

## 2024-06-22 ENCOUNTER — Other Ambulatory Visit: Payer: Self-pay | Admitting: Internal Medicine

## 2024-08-12 ENCOUNTER — Other Ambulatory Visit: Payer: Self-pay | Admitting: Internal Medicine

## 2024-09-14 ENCOUNTER — Other Ambulatory Visit: Payer: Self-pay | Admitting: Internal Medicine

## 2024-10-13 ENCOUNTER — Other Ambulatory Visit: Payer: Self-pay | Admitting: Internal Medicine
